# Patient Record
Sex: Male | Born: 1960 | Hispanic: Yes | Marital: Married | Smoking: Never smoker
Health system: Southern US, Community
[De-identification: ages and names within clinical notes are randomized; demographics above are authoritative.]

## PROBLEM LIST (undated history)

## (undated) ENCOUNTER — Emergency Department

## (undated) DIAGNOSIS — H269 Unspecified cataract: Secondary | ICD-10-CM

## (undated) DIAGNOSIS — K219 Gastro-esophageal reflux disease without esophagitis: Secondary | ICD-10-CM

## (undated) DIAGNOSIS — M1990 Unspecified osteoarthritis, unspecified site: Secondary | ICD-10-CM

## (undated) DIAGNOSIS — F329 Major depressive disorder, single episode, unspecified: Secondary | ICD-10-CM

## (undated) DIAGNOSIS — E119 Type 2 diabetes mellitus without complications: Secondary | ICD-10-CM

## (undated) DIAGNOSIS — E785 Hyperlipidemia, unspecified: Secondary | ICD-10-CM

## (undated) DIAGNOSIS — F32A Depression, unspecified: Secondary | ICD-10-CM

## (undated) HISTORY — DX: Major depressive disorder, single episode, unspecified: F32.9

## (undated) HISTORY — DX: Unspecified osteoarthritis, unspecified site: M19.90

## (undated) HISTORY — DX: Unspecified cataract: H26.9

## (undated) HISTORY — DX: Type 2 diabetes mellitus without complications: E11.9

## (undated) HISTORY — DX: Gastro-esophageal reflux disease without esophagitis: K21.9

## (undated) HISTORY — DX: Depression, unspecified: F32.A

## (undated) HISTORY — PX: EYE SURGERY: SHX253

## (undated) HISTORY — DX: Hyperlipidemia, unspecified: E78.5

---

## 1998-08-17 ENCOUNTER — Emergency Department (HOSPITAL_COMMUNITY): Admission: EM | Admit: 1998-08-17 | Discharge: 1998-08-18 | Payer: Self-pay | Admitting: Emergency Medicine

## 2000-01-23 ENCOUNTER — Ambulatory Visit (HOSPITAL_COMMUNITY): Admission: RE | Admit: 2000-01-23 | Discharge: 2000-01-23 | Payer: Self-pay | Admitting: *Deleted

## 2000-01-23 ENCOUNTER — Encounter: Payer: Self-pay | Admitting: *Deleted

## 2007-05-18 ENCOUNTER — Emergency Department: Payer: Self-pay | Admitting: Emergency Medicine

## 2015-02-02 ENCOUNTER — Encounter: Payer: Self-pay | Admitting: Nurse Practitioner

## 2015-02-02 ENCOUNTER — Ambulatory Visit (INDEPENDENT_AMBULATORY_CARE_PROVIDER_SITE_OTHER): Payer: BLUE CROSS/BLUE SHIELD | Admitting: Nurse Practitioner

## 2015-02-02 VITALS — BP 138/84 | HR 78 | Temp 98.3°F | Resp 12 | Ht 66.0 in | Wt 201.8 lb

## 2015-02-02 DIAGNOSIS — Z130 Encounter for screening for diseases of the blood and blood-forming organs and certain disorders involving the immune mechanism: Secondary | ICD-10-CM

## 2015-02-02 DIAGNOSIS — Z7689 Persons encountering health services in other specified circumstances: Secondary | ICD-10-CM

## 2015-02-02 DIAGNOSIS — Z131 Encounter for screening for diabetes mellitus: Secondary | ICD-10-CM

## 2015-02-02 DIAGNOSIS — Z7189 Other specified counseling: Secondary | ICD-10-CM

## 2015-02-02 DIAGNOSIS — R1032 Left lower quadrant pain: Secondary | ICD-10-CM

## 2015-02-02 MED ORDER — OMEPRAZOLE 40 MG PO CPDR: 40.0000 mg | DELAYED_RELEASE_CAPSULE | Freq: Every day | ORAL | Status: DC

## 2015-02-02 NOTE — Progress Notes (Signed)
Pre visit review using our clinic review tool, if applicable. No additional management support is needed unless otherwise documented below in the visit note. 

## 2015-02-02 NOTE — Progress Notes (Signed)
   Subjective:    Patient ID: Troy Ellis, male    DOB: 16-Apr-1961, 53 y.o.   MRN: [id number]  HPI  Troy Ellis is a 54 yo male here to establish care and CC of problems with gastrointestinal symptoms. His primary language is [place]. His daughter is accompanying him today in case he needs help with translation.   1) New Pt info:  Diet- Eats at home more often  Exercise- No formal  Immunizations- Need records  Colonoscopy- No colonoscopy, would like consult  PSA- checked last year   Eye Exam- Been awhile he reports  Dental Exam- Up to date   2) Chronic Problems-  Abdominal pain- between 3-6 months    Nausea, pain below breast bone, metallic taste- with eating beans or steak, staying the same, taking acid reducers every once in awhile- sometimes helpful.     Review of Systems  Constitutional: Negative for fever, chills, diaphoresis, activity change and fatigue.  HENT: Negative for sore throat, tinnitus and trouble swallowing.   Eyes: Negative for visual disturbance.  Respiratory: Negative for chest tightness, shortness of breath and wheezing.   Cardiovascular: Negative for chest pain, palpitations and leg swelling.  Gastrointestinal: Positive for abdominal pain. Negative for nausea, vomiting, diarrhea, constipation, blood in stool, abdominal distention, anal bleeding and rectal pain.  Genitourinary: Negative for dysuria and difficulty urinating.  Musculoskeletal: Negative for myalgias, back pain, joint swelling, arthralgias, gait problem, neck pain and neck stiffness.  Skin: Negative for rash.  Neurological: Negative for dizziness, weakness and numbness.       Objective:   Physical Exam  Constitutional: He is oriented to person, place, and time. He appears well-developed and well-nourished. No distress.  BP 138/84 mmHg  Pulse 78  Temp(Src) 98.3 F (36.8 C) (Oral)  Resp 12  Ht 5\' 6"  (1.676 m)  Wt 201 lb 12.8 oz (91.536 kg)  BMI 32.59 kg/m2  SpO2 96%   HENT:  Head:  Normocephalic and atraumatic.  Right Ear: External ear normal.  Left Ear: External ear normal.  Cardiovascular: Normal rate, regular rhythm, normal heart sounds and intact distal pulses.  Exam reveals no gallop and no friction rub.   No murmur heard. Pulmonary/Chest: Effort normal and breath sounds normal. No respiratory distress. He has no wheezes. He has no rales. He exhibits no tenderness.  Abdominal: Soft. Bowel sounds are normal. He exhibits no distension and no mass. There is tenderness. There is no rebound and no guarding.  Abdomen was tender with deep palpation only in the left lower quadrant  Neurological: He is alert and oriented to person, place, and time.  Skin: Skin is warm and dry. No rash noted. He is not diaphoretic.  Psychiatric: He has a normal mood and affect. His behavior is normal. Judgment and thought content normal.         Assessment & Plan:

## 2015-02-02 NOTE — Patient Instructions (Addendum)
Please visit the lab before leaving today.   We will contact you about your referral to gastroenterology.   We will follow up in 3 months.   Welcome to [facility]!

## 2015-02-03 LAB — CBC WITH DIFFERENTIAL/PLATELET
Basophils Absolute: 0 10*3/uL (ref 0.0–0.1)
Basophils Relative: 0.3 % (ref 0.0–3.0)
Eosinophils Absolute: 0.2 10*3/uL (ref 0.0–0.7)
Eosinophils Relative: 3.8 % (ref 0.0–5.0)
HCT: 42.9 % (ref 39.0–52.0)
Hemoglobin: 15.1 g/dL (ref 13.0–17.0)
Lymphocytes Relative: 28.1 % (ref 12.0–46.0)
Lymphs Abs: 1.3 10*3/uL (ref 0.7–4.0)
MCHC: 35.3 g/dL (ref 30.0–36.0)
MCV: 87.4 fl (ref 78.0–100.0)
Monocytes Absolute: 0.3 10*3/uL (ref 0.1–1.0)
Monocytes Relative: 6.4 % (ref 3.0–12.0)
Neutro Abs: 2.9 10*3/uL (ref 1.4–7.7)
Neutrophils Relative %: 61.4 % (ref 43.0–77.0)
Platelets: 157 10*3/uL (ref 150.0–400.0)
RBC: 4.91 Mil/uL (ref 4.22–5.81)
RDW: 13.1 % (ref 11.5–15.5)
WBC: 4.7 10*3/uL (ref 4.0–10.5)

## 2015-02-03 LAB — COMPREHENSIVE METABOLIC PANEL
ALT: 68 U/L — ABNORMAL HIGH (ref 0–53)
AST: 38 U/L — ABNORMAL HIGH (ref 0–37)
Albumin: 4.5 g/dL (ref 3.5–5.2)
Alkaline Phosphatase: 131 U/L — ABNORMAL HIGH (ref 39–117)
BUN: 24 mg/dL — ABNORMAL HIGH (ref 6–23)
CO2: 28 mEq/L (ref 19–32)
Calcium: 9.6 mg/dL (ref 8.4–10.5)
Chloride: 102 mEq/L (ref 96–112)
Creatinine, Ser: 0.8 mg/dL (ref 0.40–1.50)
GFR: 107.71 mL/min (ref >60.00)
Glucose, Bld: 160 mg/dL — ABNORMAL HIGH (ref 70–99)
Potassium: 4 mEq/L (ref 3.5–5.1)
Sodium: 136 mEq/L (ref 135–145)
Total Bilirubin: 0.4 mg/dL (ref 0.2–1.2)
Total Protein: 7.3 g/dL (ref 6.0–8.3)

## 2015-02-03 LAB — HEMOGLOBIN A1C: Hgb A1c MFr Bld: 6.8 % — ABNORMAL HIGH (ref 4.6–6.5)

## 2015-02-06 DIAGNOSIS — R1032 Left lower quadrant pain: Secondary | ICD-10-CM | POA: Insufficient documentation

## 2015-02-06 DIAGNOSIS — Z0000 Encounter for general adult medical examination without abnormal findings: Secondary | ICD-10-CM | POA: Insufficient documentation

## 2015-02-06 NOTE — Assessment & Plan Note (Addendum)
Discussed acute and chronic issues. Reviewed health maintenance measures, PFSHx, and immunizations. Obtain routine labs CBC w/ diff, A1c, and CMET. FU in 3 months

## 2015-02-06 NOTE — Assessment & Plan Note (Addendum)
Intermittent stable tenderness of LLQ- possible diverticulosis, will obtain CBC along with other needed lab work to check for infection. Pt requesting consultation for colonoscopy and will mention this in referral to GI. Asked pt to continue with Prilosec 40 mg daily and sent in rx for this.

## 2015-03-04 ENCOUNTER — Encounter: Payer: Self-pay | Admitting: Nurse Practitioner

## 2015-03-04 ENCOUNTER — Ambulatory Visit (INDEPENDENT_AMBULATORY_CARE_PROVIDER_SITE_OTHER): Payer: BLUE CROSS/BLUE SHIELD | Admitting: Nurse Practitioner

## 2015-03-04 VITALS — BP 108/62 | HR 60 | Temp 98.1°F | Resp 14 | Ht 66.0 in | Wt 200.0 lb

## 2015-03-04 DIAGNOSIS — IMO0002 Reserved for concepts with insufficient information to code with codable children: Secondary | ICD-10-CM

## 2015-03-04 DIAGNOSIS — R61 Generalized hyperhidrosis: Secondary | ICD-10-CM

## 2015-03-04 DIAGNOSIS — E1165 Type 2 diabetes mellitus with hyperglycemia: Secondary | ICD-10-CM | POA: Diagnosis not present

## 2015-03-04 DIAGNOSIS — Z87898 Personal history of other specified conditions: Secondary | ICD-10-CM

## 2015-03-04 LAB — TSH: TSH: 0.81 u[IU]/mL (ref 0.35–4.50)

## 2015-03-04 MED ORDER — METFORMIN HCL 500 MG PO TABS: 500.0000 mg | ORAL_TABLET | Freq: Two times a day (BID) | ORAL | Status: DC

## 2015-03-04 NOTE — Progress Notes (Signed)
Pre visit review using our clinic review tool, if applicable. No additional management support is needed unless otherwise documented below in the visit note. 

## 2015-03-04 NOTE — Patient Instructions (Signed)
Start on the Metformin. Take with meals twice a day  We will follow up in May for a re-evaluation of your A1c (3 months of blood sugars)   Goal for exercise- start 10-15 minutes a few times a week and work up to 30 min. 5 times a week.   Going for a walk, walking on the treadmill, and using light hand weights are helpful.

## 2015-03-04 NOTE — Progress Notes (Signed)
   Subjective:    Patient ID: Troy Ellis, male    DOB: [birth year], 54 y.o.   MRN: [id number]  HPI  Mr. Finbar is a 54 yo male here for a diabetic follow up. He is accompanied by his wife who is the primary historian.   1) Works during the day and in the evening he reports he sits down and watches tv.    No formal exercise, Diet- Tortillas   Eats flower and corn, amd [place], no sodas, gatorade   Frequent urination, every hour he reports   Frequent sweats Nocturia- 5-6 few nights, most nights up 2 x to urinate  Review of Systems  Constitutional: Positive for diaphoresis. Negative for fever, chills and fatigue.  Gastrointestinal: Negative for nausea, vomiting and diarrhea.  Endocrine: Positive for polydipsia and polyuria. Negative for polyphagia.  Genitourinary:       Nocturia      Objective:   Physical Exam  Constitutional: He is oriented to person, place, and time. He appears well-developed and well-nourished. No distress.  BP 108/62 mmHg  Pulse 60  Temp(Src) 98.1 F (36.7 C) (Oral)  Resp 14  Ht 5\' 6"  (1.676 m)  Wt 200 lb (90.719 kg)  BMI 32.30 kg/m2  SpO2 97%   HENT:  Head: Normocephalic and atraumatic.  Right Ear: External ear normal.  Left Ear: External ear normal.  Mouth/Throat: No oropharyngeal exudate.  Cardiovascular: Normal rate, regular rhythm and normal heart sounds.  Exam reveals no gallop and no friction rub.   No murmur heard. Pulmonary/Chest: Effort normal and breath sounds normal. No respiratory distress. He has no wheezes. He has no rales. He exhibits no tenderness.  Abdominal:  Obese  Neurological: He is alert and oriented to person, place, and time.  Skin: Skin is warm and dry. No rash noted. He is not diaphoretic.  Psychiatric: He has a normal mood and affect. His behavior is normal. Judgment and thought content normal.       Assessment & Plan:

## 2015-03-10 DIAGNOSIS — E119 Type 2 diabetes mellitus without complications: Secondary | ICD-10-CM | POA: Insufficient documentation

## 2015-03-10 DIAGNOSIS — Z87898 Personal history of other specified conditions: Secondary | ICD-10-CM | POA: Insufficient documentation

## 2015-03-10 NOTE — Assessment & Plan Note (Signed)
Will obtain TSH to check thyroid status for excessive sweating.

## 2015-03-10 NOTE — Assessment & Plan Note (Signed)
Lab Results  Component Value Date   HGBA1C 6.8* 02/02/2015   Lab Results  Component Value Date   CREATININE 0.80 02/02/2015   Discussed diet and exercise goals. Gave handout with information regarding a low carb diet. Will start on Metformin 500 mg tablet twice daily for 2 months and re-check A1c and get a microalbumin in May. Encouraged getting an Eye exam.

## 2015-04-04 ENCOUNTER — Encounter: Payer: Self-pay | Admitting: Nurse Practitioner

## 2015-05-06 ENCOUNTER — Ambulatory Visit: Payer: BLUE CROSS/BLUE SHIELD | Admitting: Nurse Practitioner

## 2015-05-13 ENCOUNTER — Ambulatory Visit: Payer: BLUE CROSS/BLUE SHIELD | Admitting: Nurse Practitioner

## 2015-05-27 ENCOUNTER — Ambulatory Visit: Payer: BLUE CROSS/BLUE SHIELD | Admitting: Nurse Practitioner

## 2015-07-01 ENCOUNTER — Ambulatory Visit (INDEPENDENT_AMBULATORY_CARE_PROVIDER_SITE_OTHER): Payer: Self-pay | Admitting: Nurse Practitioner

## 2015-07-01 VITALS — BP 122/78 | HR 71 | Temp 98.0°F | Resp 18 | Ht 66.0 in | Wt 191.0 lb

## 2015-07-01 DIAGNOSIS — IMO0002 Reserved for concepts with insufficient information to code with codable children: Secondary | ICD-10-CM

## 2015-07-01 DIAGNOSIS — E1165 Type 2 diabetes mellitus with hyperglycemia: Secondary | ICD-10-CM

## 2015-07-01 DIAGNOSIS — R748 Abnormal levels of other serum enzymes: Secondary | ICD-10-CM

## 2015-07-01 LAB — HEPATIC FUNCTION PANEL
ALT: 42 U/L (ref 0–53)
AST: 27 U/L (ref 0–37)
Albumin: 4.2 g/dL (ref 3.5–5.2)
Alkaline Phosphatase: 81 U/L (ref 39–117)
BILIRUBIN TOTAL: 0.6 mg/dL (ref 0.2–1.2)
Bilirubin, Direct: 0.1 mg/dL (ref 0.0–0.3)
TOTAL PROTEIN: 6.8 g/dL (ref 6.0–8.3)

## 2015-07-01 LAB — MICROALBUMIN / CREATININE URINE RATIO
Creatinine,U: 155.2 mg/dL
Microalb Creat Ratio: 0.5 mg/g (ref 0.0–30.0)
Microalb, Ur: <0.7 mg/dL (ref 0.0–1.9)

## 2015-07-01 LAB — HEMOGLOBIN A1C: Hgb A1c MFr Bld: 6.1 % (ref 4.6–6.5)

## 2015-07-01 MED ORDER — METFORMIN HCL 500 MG PO TABS: 500.0000 mg | ORAL_TABLET | Freq: Two times a day (BID) | ORAL | Status: DC

## 2015-07-01 NOTE — Patient Instructions (Signed)
Onychomycosis/Fungal Toenails  WHAT IS IT? An infection that lies within the keratin of your nail plate that is caused by a fungus.  WHY ME? Fungal infections affect all ages, sexes, races, and creeds.  There may be many factors that predispose you to a fungal infection such as age, coexisting medical conditions such as diabetes, or an autoimmune disease; stress, medications, fatigue, genetics, etc.  Bottom line: fungus thrives in a warm, moist environment and your shoes offer such a location.  IS IT CONTAGIOUS? Theoretically, yes.  You do not want to share shoes, nail clippers or files with someone who has fungal toenails.  Walking around barefoot in the same room or sleeping in the same bed is unlikely to transfer the organism.  It is important to realize, however, that fungus can spread easily from one nail to the next on the same foot.  HOW DO WE TREAT THIS?  There are several ways to treat this condition.  Treatment may depend on many factors such as age, medications, pregnancy, liver and kidney conditions, etc.  It is best to ask your doctor which options are available to you.  1. No treatment.   Unlike many other medical concerns, you can live with this condition.  However for many people this can be a painful condition and may lead to ingrown toenails or a bacterial infection.  It is recommended that you keep the nails cut short to help reduce the amount of fungal nail. 2. Topical treatment.  These range from herbal remedies to prescription strength nail lacquers.  About 40-50% effective, topicals require twice daily application for approximately 9 to 12 months or until an entirely new nail has grown out.  The most effective topicals are medical grade medications available through physicians offices. 1. Lotrimin Ultra- Over the counter there are many choices over the counter 3. Oral antifungal medications.  With an 80-90% cure rate, the most common oral medication requires 3 to 4 months of  therapy and stays in your system for a year as the new nail grows out.  Oral antifungal medications do require blood work to make sure it is a safe drug for you.  A liver function panel will be performed prior to starting the medication and after the first month of treatment.  It is important to have the blood work performed to avoid any harmful side effects.  In general, this medication safe but blood work is required. 4. Laser Therapy.  This treatment is performed by applying a specialized laser to the affected nail plate.  This therapy is noninvasive, fast, and non-painful.  It is not covered by insurance and is therefore, out of pocket.  The results have been very good with a 80-95% cure rate.  [facility] is the only practice in the area to offer this therapy. 5. Permanent Nail Avulsion.  Removing the entire nail so that a new nail will not grow back.

## 2015-07-01 NOTE — Progress Notes (Signed)
   Subjective:    Patient ID: Troy Ellis, male    DOB: Feb 01, 1961, 54 y.o.   MRN: [id number]  HPI  Troy Ellis is a 54 yo male with a follow up on his diabetes.   1) Is taking the metformin and doing well. Lost 9 lbs. Working hard and trying to lose weight.   Wt Readings from Last 3 Encounters:  07/01/15 191 lb (86.637 kg)  03/04/15 200 lb (90.719 kg)  02/02/15 201 lb 12.8 oz (91.536 kg)   2) Pt is concerned about his toenails being fungal.   Review of Systems  Constitutional: Negative for fever, chills, diaphoresis and fatigue.  Eyes: Negative for visual disturbance.  Respiratory: Negative for chest tightness, shortness of breath and wheezing.   Cardiovascular: Negative for chest pain, palpitations and leg swelling.  Gastrointestinal: Negative for nausea, vomiting and diarrhea.  Endocrine: Negative for polydipsia, polyphagia and polyuria.  Skin: Negative for rash.       Fungal changes to toenails  Neurological: Negative for dizziness, weakness and numbness.  Psychiatric/Behavioral: The patient is not nervous/anxious.       Objective:   Physical Exam  Constitutional: He is oriented to person, place, and time. He appears well-developed and well-nourished. No distress.  BP 122/78 mmHg  Pulse 71  Temp(Src) 98 F (36.7 C)  Resp 18  Ht 5\' 6"  (1.676 m)  Wt 191 lb (86.637 kg)  BMI 30.84 kg/m2  SpO2 95%   HENT:  Head: Normocephalic and atraumatic.  Right Ear: External ear normal.  Left Ear: External ear normal.  Cardiovascular: Normal rate, regular rhythm, normal heart sounds and intact distal pulses.  Exam reveals no gallop and no friction rub.   No murmur heard. Pulmonary/Chest: Effort normal and breath sounds normal. No respiratory distress. He has no wheezes. He has no rales. He exhibits no tenderness.  Neurological: He is alert and oriented to person, place, and time.  Skin: Skin is warm and dry. No rash noted. He is not diaphoretic.  Fungal changes to great  toenails  Psychiatric: He has a normal mood and affect. His behavior is normal. Judgment and thought content normal.      Assessment & Plan:

## 2015-07-12 ENCOUNTER — Encounter: Payer: Self-pay | Admitting: Nurse Practitioner

## 2015-07-12 DIAGNOSIS — K760 Fatty (change of) liver, not elsewhere classified: Secondary | ICD-10-CM | POA: Insufficient documentation

## 2015-07-12 NOTE — Assessment & Plan Note (Signed)
Pt asked about oral medication for toenail fungus and I discussed his history of elevated liver enzymes is not okay for placing on medications for this due to risk of liver injury. Asked pt to try topical OTC measures first. Will follow.

## 2015-07-12 NOTE — Assessment & Plan Note (Signed)
Getting A1c, urine microalbumin, and HFP today. Pt stable on Metformin and changing diet. Encouraged eye exam. Discussed next visit getting immunizations up to date. No wounds- fungal changes to toenails.

## 2015-08-09 ENCOUNTER — Other Ambulatory Visit: Payer: Self-pay | Admitting: Surgical

## 2015-08-09 MED ORDER — METFORMIN HCL 500 MG PO TABS: 500.0000 mg | ORAL_TABLET | Freq: Two times a day (BID) | ORAL | Status: DC

## 2015-08-09 NOTE — Telephone Encounter (Signed)
Patient is requesting 90 day supply.

## 2015-08-24 LAB — LIPID PANEL
CHOLESTEROL: 193 mg/dL (ref 0–200)
HDL: 44 mg/dL (ref 35–70)
LDL Cholesterol: 112 mg/dL
Triglycerides: 187 mg/dL — AB (ref 40–160)

## 2015-08-24 LAB — BASIC METABOLIC PANEL
BUN: 20 mg/dL (ref 4–21)
CREATININE: 0.9 mg/dL (ref 0.6–1.3)
GLUCOSE: 120 mg/dL
POTASSIUM: 4 mmol/L (ref 3.4–5.3)
Sodium: 141 mmol/L (ref 137–147)

## 2015-08-24 LAB — CBC AND DIFFERENTIAL
Platelets: 123 10*3/uL — AB (ref 150–399)
WBC: 4.5 10^3/mL

## 2015-08-25 ENCOUNTER — Ambulatory Visit (INDEPENDENT_AMBULATORY_CARE_PROVIDER_SITE_OTHER): Payer: Managed Care, Other (non HMO) | Admitting: Nurse Practitioner

## 2015-08-25 ENCOUNTER — Encounter: Payer: Self-pay | Admitting: Nurse Practitioner

## 2015-08-25 VITALS — BP 120/78 | HR 60 | Temp 98.2°F | Resp 18 | Ht 66.0 in | Wt 189.6 lb

## 2015-08-25 DIAGNOSIS — Z09 Encounter for follow-up examination after completed treatment for conditions other than malignant neoplasm: Secondary | ICD-10-CM | POA: Diagnosis not present

## 2015-08-25 NOTE — Progress Notes (Signed)
Patient ID: Troy Ellis, male    DOB: [birth year]  Age: 54 y.o. MRN: [id number]  CC: Hospitalization Follow-up   HPI Troy Ellis presents for Hospital Follow up for unspecified chest pain.   1) Pt reports symptoms of: Light headed, vision changes, pre-syncope. He was on the job in [place]. He was taken to an UC and they gave him 2 nitro for chest pain then they transported him to  [facility] in [place]- 2 nitro and ASA  Works outside and feels pre-syncopal when bending over and standing up fast.   Wife reports:  Labs Stress test  Echo? (May mean EKG)  Feels improved, but doesn't feel like himself    Felt this way in the past, passes after a few minutes   History [name] has a past medical history of Arthritis; Cataract; and Depression.   He has past surgical history that includes Eye surgery.   His family history includes Diabetes in his brother and brother.He reports that he has never smoked. He has never used smokeless tobacco. He reports that he drinks alcohol. He reports that he does not use illicit drugs.  Outpatient Prescriptions Prior to Visit  Medication Sig Dispense Refill  . omeprazole (PRILOSEC) 40 MG capsule Take 1 capsule (40 mg total) by mouth daily. 30 capsule 11  . metFORMIN (GLUCOPHAGE) 500 MG tablet Take 1 tablet (500 mg total) by mouth 2 (two) times daily with a meal. 180 tablet 0   No facility-administered medications prior to visit.    ROS Review of Systems  Constitutional: Negative for fever, chills, diaphoresis and fatigue.  Eyes: Negative for visual disturbance.  Respiratory: Negative for chest tightness, shortness of breath and wheezing.   Cardiovascular: Negative for chest pain, palpitations and leg swelling.  Gastrointestinal: Negative for nausea, vomiting and diarrhea.  Endocrine: Negative for polydipsia, polyphagia and polyuria.  Skin: Negative for rash.  Neurological: Negative for dizziness, weakness and numbness.   Psychiatric/Behavioral: The patient is not nervous/anxious.     Objective:  BP 120/78 mmHg  Pulse 60  Temp(Src) 98.2 F (36.8 C)  Resp 18  Ht 5\' 6"  (1.676 m)  Wt 189 lb 9.6 oz (86.002 kg)  BMI 30.62 kg/m2  SpO2 95%  Physical Exam  Constitutional: He is oriented to person, place, and time. He appears well-developed and well-nourished. No distress.  HENT:  Head: Normocephalic and atraumatic.  Right Ear: External ear normal.  Left Ear: External ear normal.  Cardiovascular: Normal rate, regular rhythm and normal heart sounds.  Exam reveals no gallop and no friction rub.   No murmur heard. Pulmonary/Chest: Effort normal and breath sounds normal. No respiratory distress. He has no wheezes. He has no rales. He exhibits no tenderness.  Neurological: He is alert and oriented to person, place, and time.  Skin: Skin is warm and dry. No rash noted. He is not diaphoretic.  Psychiatric: He has a normal mood and affect. His behavior is normal. Judgment and thought content normal.   Assessment & Plan:   Troy Ellis was seen today for hospitalization follow-up.  Diagnoses and all orders for this visit:  Hospital discharge follow-up  I have discontinued [name] metFORMIN. I am also having him maintain his omeprazole.  No orders of the defined types were placed in this encounter.     Follow-up: Return in about 3 months (around 11/24/2015).

## 2015-08-25 NOTE — Assessment & Plan Note (Signed)
Pt did not bring any paperwork. Will obtain records from [place]. Will add ASA 81 mg to daily regimen. Stop Metformin. Pt reports abdominal discomfort. Will obtain a1c in 3 months after being off of the medication. Will FU in 3 months.

## 2015-08-25 NOTE — Progress Notes (Signed)
Pre visit review using our clinic review tool, if applicable. No additional management support is needed unless otherwise documented below in the visit note. 

## 2015-08-25 NOTE — Patient Instructions (Signed)
Stay off of the metformin for 3 months and we can re-check your A1c.   Baby Aspirin (81 mg) enteric coated (EC) daily.   Continue to drink good water and eat enough to sustain your activities.   Follow up in 3 months.

## 2015-10-07 ENCOUNTER — Ambulatory Visit (INDEPENDENT_AMBULATORY_CARE_PROVIDER_SITE_OTHER): Payer: Managed Care, Other (non HMO) | Admitting: Nurse Practitioner

## 2015-10-07 ENCOUNTER — Ambulatory Visit: Payer: 59 | Admitting: Nurse Practitioner

## 2015-10-07 VITALS — BP 138/80 | HR 51 | Temp 98.0°F | Resp 18 | Ht 66.0 in | Wt 190.0 lb

## 2015-10-07 DIAGNOSIS — M1990 Unspecified osteoarthritis, unspecified site: Secondary | ICD-10-CM

## 2015-10-07 DIAGNOSIS — Z23 Encounter for immunization: Secondary | ICD-10-CM | POA: Diagnosis not present

## 2015-10-07 DIAGNOSIS — E119 Type 2 diabetes mellitus without complications: Secondary | ICD-10-CM | POA: Diagnosis not present

## 2015-10-07 LAB — CBC WITH DIFFERENTIAL/PLATELET
Basophils Absolute: 0 10*3/uL (ref 0.0–0.1)
Basophils Relative: 0.4 % (ref 0.0–3.0)
EOS ABS: 0.1 10*3/uL (ref 0.0–0.7)
EOS PCT: 3.7 % (ref 0.0–5.0)
HEMATOCRIT: 44.2 % (ref 39.0–52.0)
Hemoglobin: 15 g/dL (ref 13.0–17.0)
LYMPHS ABS: 1.2 10*3/uL (ref 0.7–4.0)
LYMPHS PCT: 29 % (ref 12.0–46.0)
MCHC: 33.8 g/dL (ref 30.0–36.0)
MCV: 90.1 fl (ref 78.0–100.0)
MONO ABS: 0.3 10*3/uL (ref 0.1–1.0)
MONOS PCT: 7.7 % (ref 3.0–12.0)
Neutro Abs: 2.4 10*3/uL (ref 1.4–7.7)
Neutrophils Relative %: 59.2 % (ref 43.0–77.0)
PLATELETS: 147 10*3/uL — AB (ref 150.0–400.0)
RBC: 4.91 Mil/uL (ref 4.22–5.81)
RDW: 13.3 % (ref 11.5–15.5)
WBC: 4 10*3/uL (ref 4.0–10.5)

## 2015-10-07 LAB — HEMOGLOBIN A1C: HEMOGLOBIN A1C: 6.1 % (ref 4.6–6.5)

## 2015-10-07 MED ORDER — MELOXICAM 15 MG PO TABS: 15.0000 mg | ORAL_TABLET | Freq: Every day | ORAL | Status: DC

## 2015-10-07 NOTE — Patient Instructions (Signed)
Please visit the lab before leaving today.   See you in 6 months!

## 2015-10-07 NOTE — Progress Notes (Signed)
Patient ID: Troy Ellis, male    DOB: 11/22/1961  Age: 54 y.o. MRN: [id number]  CC: Follow-up   HPI Troy Ellis presents for follow up of diabetes, arthritis, and weight.  1) Feeling well. No complaints, patient reports Troy Ellis is compliant with diet and exercise. Eye exam- Scheduled Dec. 2nd   Weight is up 1 lb from last visit.  2) Joint pain in wrists, hands and knees  Right knee > left   Aleve, ibuprofen, tylenol, heat and ice   History Troy Ellis has a past medical history of Arthritis; Cataract; and Depression.   Troy Ellis has past surgical history that includes Eye surgery.   His family history includes Diabetes in his brother and brother.Troy Ellis reports that Troy Ellis has never smoked. Troy Ellis has never used smokeless tobacco. Troy Ellis reports that Troy Ellis drinks alcohol. Troy Ellis reports that Troy Ellis does not use illicit drugs.  Outpatient Prescriptions Prior to Visit  Medication Sig Dispense Refill  . omeprazole (PRILOSEC) 40 MG capsule Take 1 capsule (40 mg total) by mouth daily. 30 capsule 11   No facility-administered medications prior to visit.    ROS Review of Systems  Constitutional: Negative for fever, chills, diaphoresis and fatigue.  Eyes: Negative for visual disturbance.  Respiratory: Negative for chest tightness, shortness of breath and wheezing.   Cardiovascular: Negative for chest pain, palpitations and leg swelling.  Gastrointestinal: Negative for nausea, vomiting and diarrhea.  Endocrine: Negative for polydipsia, polyphagia and polyuria.  Musculoskeletal: Positive for arthralgias.       Joint pain in wrists, hands, and knees.  Skin: Negative for rash.  Neurological: Negative for dizziness, weakness and numbness.  Psychiatric/Behavioral: The patient is not nervous/anxious.     Objective:  BP 138/80 mmHg  Pulse 51  Temp(Src) 98 F (36.7 C)  Resp 18  Ht 5\' 6"  (1.676 m)  Wt 190 lb (86.183 kg)  BMI 30.68 kg/m2  SpO2 97%  Physical Exam  Constitutional: Troy Ellis is oriented to person, place,  and time. Troy Ellis appears well-developed and well-nourished. No distress.  HENT:  Head: Normocephalic and atraumatic.  Right Ear: External ear normal.  Left Ear: External ear normal.  Cardiovascular: Normal rate, regular rhythm and normal heart sounds.  Exam reveals no gallop and no friction rub.   No murmur heard. Pulmonary/Chest: Effort normal and breath sounds normal. No respiratory distress. Troy Ellis has no wheezes. Troy Ellis has no rales. Troy Ellis exhibits no tenderness.  Musculoskeletal: Normal range of motion. Troy Ellis exhibits no edema or tenderness.  Pt is able to perform range of motion exercises with wrists, hands, knees without difficulty. There is no tenderness or edema to palpation of these joints.  Neurological: Troy Ellis is alert and oriented to person, place, and time.  Strength is normal in iliopsoas, quadriceps, extension of wrists and flexion of wrists. Fingers also have adequate strength with abduction and abduction. Grip strength is equal bilaterally  Skin: Skin is warm and dry. No rash noted. Troy Ellis is not diaphoretic.  Psychiatric: Troy Ellis has a normal mood and affect. His behavior is normal. Judgment and thought content normal.      Assessment & Plan:   Srijan was seen today for follow-up.  Diagnoses and all orders for this visit:  Encounter for immunization  Controlled type 2 diabetes mellitus without complication, without long-term current use of insulin (HCC) -     HgB A1c -     CBC with Differential/Platelet  Other orders -     Flu Vaccine QUAD 36+ mos IM -  meloxicam (MOBIC) 15 MG tablet; Take 1 tablet (15 mg total) by mouth daily.   I am having Troy Ellis start on meloxicam. I am also having him maintain his omeprazole.  Meds ordered this encounter  Medications  . meloxicam (MOBIC) 15 MG tablet    Sig: Take 1 tablet (15 mg total) by mouth daily.    Dispense:  30 tablet    Refill:  2    Order Specific Question:  Supervising Provider    Answer:  Crecencio Mc [[id number]]      Follow-up: Return in about 6 months (around 04/06/2016).

## 2015-10-07 NOTE — Progress Notes (Signed)
Pre visit review using our clinic review tool, if applicable. No additional management support is needed unless otherwise documented below in the visit note. 

## 2015-10-14 ENCOUNTER — Encounter: Payer: Self-pay | Admitting: Nurse Practitioner

## 2015-10-14 DIAGNOSIS — M1990 Unspecified osteoarthritis, unspecified site: Secondary | ICD-10-CM | POA: Insufficient documentation

## 2015-10-14 NOTE — Assessment & Plan Note (Signed)
A she is very active at current job. He uses his hands wrists and knees frequently. He has not tried previous treatments and would like to try medication he is currently taking over-the-counter NSAIDs. Asked him to stop these and we will try Mobitz 15 mg once daily. If he needs other medications asked him to use Tylenol. Patient was agreeable to this and we will follow-up in 6 months or sooner as needed

## 2015-10-14 NOTE — Assessment & Plan Note (Signed)
We're checking A1c and CBC with differential today. Patient is doing well asked him to continue working on diet and exercise.

## 2015-11-25 ENCOUNTER — Ambulatory Visit: Payer: 59 | Admitting: Nurse Practitioner

## 2015-12-03 ENCOUNTER — Other Ambulatory Visit: Payer: Self-pay | Admitting: Nurse Practitioner

## 2016-02-03 ENCOUNTER — Encounter: Payer: Self-pay | Admitting: Family Medicine

## 2016-02-03 ENCOUNTER — Ambulatory Visit (INDEPENDENT_AMBULATORY_CARE_PROVIDER_SITE_OTHER): Payer: Managed Care, Other (non HMO) | Admitting: Family Medicine

## 2016-02-03 VITALS — BP 116/78 | HR 61 | Temp 97.6°F | Ht 66.0 in | Wt 197.2 lb

## 2016-02-03 DIAGNOSIS — M533 Sacrococcygeal disorders, not elsewhere classified: Secondary | ICD-10-CM

## 2016-02-03 MED ORDER — TRAMADOL HCL 50 MG PO TABS: 50.0000 mg | ORAL_TABLET | Freq: Three times a day (TID) | ORAL | Status: DC | PRN

## 2016-02-03 NOTE — Progress Notes (Signed)
Pre visit review using our clinic review tool, if applicable. No additional management support is needed unless otherwise documented below in the visit note. 

## 2016-02-03 NOTE — Patient Instructions (Signed)
Use the tramadol as needed. ° °Take care ° °Dr. Odena Mcquaid   °

## 2016-02-03 NOTE — Progress Notes (Signed)
Subjective:  Patient ID: LAQUINCY BRY, male    DOB: 03-03-1961  Age: 55 y.o. MRN: [id number]  CC: [place], injured tailbone, still in pain  HPI:  55 year old male presents with the above complaint.  Fall, injured tailbone  Patient reports that he suffered a fall on Sunday. He failed directly onto his bottom injuring his tailbone.  He went to the hospital for evaluation. Hospital course reviewed and summarized as follows:  X-rays were obtained and were negative. No fracture noted. He was given pain medication and discharged home in stable condition.  Patient reports he continues to have pain that is worse with range of motion.  Pain described as "burning".  He took the Percocet that was prescribed but could not tolerate due to itching/nausea.  He is requesting something for pain today.   No known relieving factors.   No other complaints today.   Social Hx   Social History   Social History  . Marital Status: Married    Spouse Name: N/A  . Number of Children: N/A  . Years of Education: N/A   Social History Main Topics  . Smoking status: Never Smoker   . Smokeless tobacco: Never Used  . Alcohol Use: 0.0 oz/week    0 Standard drinks or equivalent per week  . Drug Use: No  . Sexual Activity: Not Asked   Other Topics Concern  . None   Social History Narrative   Review of Systems  Constitutional: Negative.   Musculoskeletal:       Pain, tailbone.   Objective:  BP 116/78 mmHg  Pulse 61  Temp(Src) 97.6 F (36.4 C) (Oral)  Ht 5\' 6"  (1.676 m)  Wt 197 lb 4 oz (89.472 kg)  BMI 31.85 kg/m2  SpO2 95%  BP/Weight 02/03/2016 [id number]  Systolic BP [id number]  Diastolic BP 78 80 78  Wt. (Lbs) 197.25 190 189.6  BMI 31.85 30.68 30.62   Physical Exam  Constitutional: He is oriented to person, place, and time. He appears well-developed. No distress.  Cardiovascular: Normal rate and regular rhythm.   Pulmonary/Chest: Effort normal and breath sounds normal.   Musculoskeletal:  Sacrum/coccyx - tender to palpation.  Neurological: He is alert and oriented to person, place, and time.  Vitals reviewed.   Lab Results  Component Value Date   WBC 4.0 10/07/2015   HGB 15.0 10/07/2015   HCT 44.2 10/07/2015   PLT 147.0* 10/07/2015   GLUCOSE 160* 02/02/2015   CHOL 193 08/24/2015   TRIG 187* 08/24/2015   HDL 44 08/24/2015   LDLCALC 112 08/24/2015   ALT 42 07/01/2015   AST 27 07/01/2015   NA 141 08/24/2015   K 4.0 08/24/2015   CL 102 02/02/2015   CREATININE 0.9 08/24/2015   BUN 20 08/24/2015   CO2 28 02/02/2015   TSH 0.81 03/04/2015   HGBA1C 6.1 10/07/2015   MICROALBUR <0.7 07/01/2015    Assessment & Plan:   Problem List Items Addressed This Visit    Coccydynia - Primary    New problem. ED course reviewed and summarized. Treating with Tramadol PRN.      Relevant Medications   traMADol (ULTRAM) 50 MG tablet      Meds ordered this encounter  Medications  . traMADol (ULTRAM) 50 MG tablet    Sig: Take 1 tablet (50 mg total) by mouth every 8 (eight) hours as needed.    Dispense:  60 tablet    Refill:  0   Follow-up:  PRN  [place]

## 2016-02-03 NOTE — Assessment & Plan Note (Signed)
New problem. ED course reviewed and summarized. Treating with Tramadol PRN.

## 2016-02-05 ENCOUNTER — Other Ambulatory Visit: Payer: Self-pay | Admitting: Nurse Practitioner

## 2016-02-06 NOTE — Telephone Encounter (Signed)
Was seen by Dr.Cook and didn't refill. Please advise?

## 2016-02-22 ENCOUNTER — Ambulatory Visit (INDEPENDENT_AMBULATORY_CARE_PROVIDER_SITE_OTHER): Payer: Managed Care, Other (non HMO) | Admitting: Gastroenterology

## 2016-02-22 ENCOUNTER — Encounter: Payer: Self-pay | Admitting: Gastroenterology

## 2016-02-22 VITALS — BP 116/60 | HR 78 | Ht 66.0 in | Wt 193.0 lb

## 2016-02-22 DIAGNOSIS — R159 Full incontinence of feces: Secondary | ICD-10-CM | POA: Diagnosis not present

## 2016-02-22 MED ORDER — NA SULFATE-K SULFATE-MG SULF 17.5-3.13-1.6 GM/177ML PO SOLN: 1.0000 | Freq: Once | ORAL | Status: DC

## 2016-02-22 NOTE — Patient Instructions (Signed)
Please start taking citrucel (orange flavored) powder fiber supplement.  This may cause some bloating at first but that usually goes away. Begin with a small spoonful and work your way up to a large, heaping spoonful daily over a week. You will be set up for a colonoscopy for fecal soilage.

## 2016-02-22 NOTE — Progress Notes (Signed)
HPI: This is a   very pleasant 55 year old man    who was referred to me by Rubbie Battiest, NP  to evaluate  fecal soilage, leakage .    Chief complaint is fecal soilage, leakage  Leakage of stool daily.  9-10 months.  Never sees blood.    No constipated and not having diarrhea.  Has regular solid BM daily or twice a day.  Overall his weight is up 4-5 pounds.  Fell 3-4 weeks ago, slipped and injured his backside.  No relation to eating.  Does not drink much etoh.  Has to wipe every hour or two.  Review of systems: Pertinent positive and negative review of systems were noted in the above HPI section. Complete review of systems was performed and was otherwise normal.   Past Medical History  Diagnosis Date  . Arthritis     both hands  . Cataract     x2 surgeries  . Depression     Past Surgical History  Procedure Laterality Date  . Eye surgery      Lasic    Current Outpatient Prescriptions  Medication Sig Dispense Refill  . omeprazole (PRILOSEC) 40 MG capsule Take 1 capsule (40 mg total) by mouth daily. 30 capsule 11   No current facility-administered medications for this visit.    Allergies as of 02/22/2016  . (No Known Allergies)    Family History  Problem Relation Age of Onset  . Diabetes Brother   . Diabetes Brother     Social History   Social History  . Marital Status: Married    Spouse Name: N/A  . Number of Children: N/A  . Years of Education: N/A   Occupational History  . Not on file.   Social History Main Topics  . Smoking status: Never Smoker   . Smokeless tobacco: Never Used  . Alcohol Use: 0.0 oz/week    0 Standard drinks or equivalent per week  . Drug Use: No  . Sexual Activity: Not on file   Other Topics Concern  . Not on file   Social History Narrative     Physical Exam: BP 116/60 mmHg  Pulse 78  Ht 5\' 6"  (1.676 m)  Wt 193 lb (87.544 kg)  BMI 31.17 kg/m2 Constitutional: generally well-appearing Psychiatric: alert and  oriented x3 Eyes: extraocular movements intact Mouth: oral pharynx moist, no lesions Neck: supple no lymphadenopathy Cardiovascular: heart regular rate and rhythm Lungs: clear to auscultation bilaterally Abdomen: soft, nontender, nondistended, no obvious ascites, no peritoneal signs, normal bowel sounds Extremities: no lower extremity edema bilaterally Skin: no lesions on visible extremities Rectal examination: Normal external examination, internally there were no distal rectal masses, stool was brown and Hemoccult negative, no strictures  Assessment and plan: 55 y.o. male with  daily fecal soilage, leakage  Unclear etiology, perhaps there is something anatomic more proximal in his rectum or sigmoid such as a stricture. I recommended we proceed with full colonoscopy at his soonest convenience he has never had colon cancer screening. In the meantime I recommended he begin once daily powder fiber supplements.   Owens Loffler, MD [facility] 02/22/2016, 2:59 PM  Cc: Rubbie Battiest, NP

## 2016-04-05 ENCOUNTER — Telehealth: Payer: Self-pay | Admitting: Gastroenterology

## 2016-04-05 NOTE — Telephone Encounter (Signed)
yes

## 2016-04-06 ENCOUNTER — Encounter: Payer: Managed Care, Other (non HMO) | Admitting: Gastroenterology

## 2016-04-06 ENCOUNTER — Ambulatory Visit: Payer: Managed Care, Other (non HMO) | Admitting: Nurse Practitioner

## 2016-05-04 ENCOUNTER — Telehealth: Payer: Self-pay | Admitting: Gastroenterology

## 2016-05-04 ENCOUNTER — Encounter: Payer: Managed Care, Other (non HMO) | Admitting: Gastroenterology

## 2016-05-07 ENCOUNTER — Telehealth: Payer: Self-pay | Admitting: *Deleted

## 2016-05-09 ENCOUNTER — Ambulatory Visit: Payer: Managed Care, Other (non HMO) | Admitting: Nurse Practitioner

## 2016-05-18 NOTE — Telephone Encounter (Signed)
Pt not Billed Colon No show Fee. Has no Insurance

## 2016-07-16 NOTE — Telephone Encounter (Signed)
  Follow up Call-  No flowsheet data found.   Patient questions:  There was a message left at the time of call during the callback after the procedure.

## 2016-08-03 ENCOUNTER — Ambulatory Visit: Payer: Managed Care, Other (non HMO) | Admitting: Family Medicine

## 2016-10-02 ENCOUNTER — Encounter: Payer: Self-pay | Admitting: Family

## 2016-10-02 ENCOUNTER — Ambulatory Visit (INDEPENDENT_AMBULATORY_CARE_PROVIDER_SITE_OTHER): Payer: BLUE CROSS/BLUE SHIELD | Admitting: Family

## 2016-10-02 VITALS — BP 130/78 | HR 72 | Temp 97.9°F | Ht 66.0 in | Wt 193.6 lb

## 2016-10-02 DIAGNOSIS — Z23 Encounter for immunization: Secondary | ICD-10-CM | POA: Diagnosis not present

## 2016-10-02 DIAGNOSIS — M542 Cervicalgia: Secondary | ICD-10-CM

## 2016-10-02 DIAGNOSIS — Z7689 Persons encountering health services in other specified circumstances: Secondary | ICD-10-CM

## 2016-10-02 MED ORDER — PREDNISONE 10 MG PO TABS: ORAL_TABLET | ORAL | 0 refills | Status: DC

## 2016-10-02 MED ORDER — DULOXETINE HCL 30 MG PO CPEP: 30.0000 mg | ORAL_CAPSULE | Freq: Two times a day (BID) | ORAL | 3 refills | Status: DC

## 2016-10-02 NOTE — Assessment & Plan Note (Signed)
Colonoscopy ordered which is due. Screening labs ordered. Patient will return when fasting to complete screening labs on Friday of this week and also will schedule CPE.

## 2016-10-02 NOTE — Patient Instructions (Addendum)
Overdue on preventative care. Please make follow up physical appointment. Please also make lab apppointment for Friday.   Trial of prednisone and cymbalta.  Let's treat conservatively as we discussed.   Over-the-counter medications you may try for arthritic pain include:   ThermaCare patches   Capsaicin cream   Icy hot   If conservative treatment doesn't yield results, we will consider physical therapy, consult to Sports Medicine/Orthopedics for further evaluation, and imaging.   If there is no improvement in your symptoms, or if there is any worsening of symptoms, or if you have any additional concerns, please return for re-evaluation; or, if we are closed, consider going to the Emergency Room for evaluation if symptoms urgent.

## 2016-10-02 NOTE — Assessment & Plan Note (Signed)
Improving. Patient also has a history of low back pain which be concurrent with this symptoms. Trial of prednisone, Cymbalta to see if this relieves pain. Normal neurologic exam. We'll continue to follow.

## 2016-10-02 NOTE — Progress Notes (Signed)
Subjective:    Patient ID: Troy Ellis, male    DOB: 06-19-1961, 55 y.o.   MRN: [id number]  CC: CHON STANDARD is a 55 y.o. male who presents today for follow up.   HPI: Patient presents for acute on chronic visit for left neck pain and also to establish care. Left pain is constant since injury years ago.  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 Wearing helmet.  Pain over left neck and radiates to left back.Has  tried medications tyleonol arthritis with some relief. Shoulder pain improves with rest. Pain not exaberated by moving left arm. Describes as 'stiffness' and occasionally numbness in arms. Shoulder and leg pain worse at end of day after work carrying supplies and walking for work. Not sleeping well due to pain.  No vision changes, HA.  Occasionally both legs also hurt, worse in left leg. no LE numbness or tingling, or  weakness. States shooting pain down his posterior left leg. Fell 6 months ago and had pain on coccyx which is  improving.   Denies exertional chest pain or pressure, numbness or tingling radiating to left arm or jaw, palpitations, dizziness, frequent headaches, changes in vision, or shortness of breath.   H/o depression and tried medication ( cannot recall name) and wife thought he was 'much better'. More irritable per wife. Not tearful.   Due for CPE and screening labs.   Patient has no history of seizure. Does not drink alcohol.   DG cervical spine 2008 no acute abnormality.  MR lumbar for low back pain and left hip pain 2001-mild disc degeneration, mild diffuse disc bulge, minimal impression left s1 nerve root.  HISTORY:  Past Medical History:  Diagnosis Date  . Arthritis    both hands  . Cataract    x2 surgeries  . Depression    Past Surgical History:  Procedure Laterality Date  . EYE SURGERY     Lasic   Family History  Problem Relation Age of Onset  . Diabetes Brother   . Diabetes Brother      Allergies: Review of patient's allergies indicates no known allergies. No current outpatient prescriptions on file prior to visit.   No current facility-administered medications on file prior to visit.     Social History  Substance Use Topics  . Smoking status: Never Smoker  . Smokeless tobacco: Never Used  . Alcohol use 0.0 oz/week    Review of Systems  Constitutional: Negative for chills and fever.  Respiratory: Negative for cough.   Cardiovascular: Negative for chest pain and palpitations.  Gastrointestinal: Negative for nausea and vomiting.  Musculoskeletal: Positive for back pain, neck pain and neck stiffness.  Neurological: Positive for numbness. Negative for dizziness, weakness and headaches.      Objective:    BP 130/78   Pulse 72   Temp 97.9 F (36.6 C) (Oral)   Ht 5\' 6"  (1.676 m)   Wt 193 lb 9.6 oz (87.8 kg)   SpO2 98%   BMI 31.25 kg/m  BP Readings from Last 3 Encounters:  10/02/16 130/78  02/22/16 116/60  02/03/16 116/78   Wt Readings from Last 3 Encounters:  10/02/16 193 lb 9.6 oz (87.8 kg)  02/22/16 193 lb (87.5 kg)  02/03/16 197 lb 4 oz (89.5 kg)    Physical Exam  Constitutional: He appears well-developed and well-nourished.  Cardiovascular: Regular rhythm and normal heart sounds.   Pulmonary/Chest: Effort normal and breath  sounds normal. No respiratory distress. He has no wheezes. He has no rales.  Musculoskeletal:       Cervical back: He exhibits tenderness. He exhibits normal range of motion, no bony tenderness, no pain and no spasm.       Lumbar back: He exhibits normal range of motion, no tenderness, no swelling, no pain and no spasm.       Back:  Neck- Pain as noted on left side of diagram. Pain with spurlings test, no numbness, tingling.    Low back- Full range of motion with flexion, extension, lateral side bends. No pain, numbness, tingling elicited with single leg raise bilaterally. No rash.  Neurological: He is alert. He has  normal strength. He displays a negative Romberg sign.  Reflex Scores:      Bicep reflexes are 2+ on the right side and 2+ on the left side.      Patellar reflexes are 2+ on the right side and 2+ on the left side. BUE 5/5. BLE 5/5. Sensation intact.  Finger to nose normal.    Skin: Skin is warm and dry.  Psychiatric: He has a normal mood and affect. His speech is normal and behavior is normal.  Vitals reviewed.      Assessment & Plan:   Problem List Items Addressed This Visit      Other   Encounter to establish care    Colonoscopy ordered which is due. Screening labs ordered. Patient will return when fasting to complete screening labs on Friday of this week and also will schedule CPE.      Relevant Orders   CBC with Differential/Platelet   Comprehensive metabolic panel   Hemoglobin A1c   Lipid panel   TSH   VITAMIN D 25 Hydroxy (Vit-D Deficiency, Fractures)   HIV antibody   PSA   Ambulatory referral to Gastroenterology   Neck pain - Primary    Trial of prednisone and Cymbalta. Cymbalta chosen also due to h/o depression. Suspect degenerative disease, likely exacerbated by the patient's line of work and injury a few years ago. Reassured by normal neurologic exam, negative Spurling's test. Follow-up in one month.      Relevant Medications   DULoxetine (CYMBALTA) 30 MG capsule   predniSONE (DELTASONE) 10 MG tablet    Other Visit Diagnoses   None.      I have discontinued [name] omeprazole and Na Sulfate-K Sulfate-Mg Sulf. I am also having him start on DULoxetine and predniSONE.   Meds ordered this encounter  Medications  . DULoxetine (CYMBALTA) 30 MG capsule    Sig: Take 1 capsule (30 mg total) by mouth 2 (two) times daily. Take one 30 mg tablet by mouth once a day for the first week. Then increase to one 30 mg tablet by mouth twice daily and stay at that dose thereafter.    Dispense:  60 capsule    Refill:  3    Order Specific Question:   Supervising Provider     Answer:   Deborra Medina L [[id number]]  . predniSONE (DELTASONE) 10 MG tablet    Sig: Take 4 tablets ( total 40 mg) by mouth for 2 days; take 3 tablets ( total 30 mg) by mouth for 2 days; take 2 tablets ( total 20 mg) by mouth for 1 day; take 1 tablet ( total 10 mg) by mouth for 1 day.    Dispense:  17 tablet    Refill:  0    Order Specific Question:  Supervising Provider    Answer:   Crecencio Mc [[id number]]    Return precautions given.   Risks, benefits, and alternatives of the medications and treatment plan prescribed today were discussed, and patient expressed understanding.   Education regarding symptom management and diagnosis given to patient on AVS.  Continue to follow with Mable Paris, FNP for routine health maintenance.   Shawnee Knapp and I agreed with plan.   Mable Paris, FNP

## 2016-10-02 NOTE — Assessment & Plan Note (Addendum)
Trial of prednisone and Cymbalta. Normotensive.Cymbalta chosen also due to h/o depression. Suspect degenerative disease, likely exacerbated by the patient's line of work and injury a few years ago. Reassured by normal neurologic exam, negative Spurling's test. Follow-up in one month.

## 2016-10-05 ENCOUNTER — Other Ambulatory Visit (INDEPENDENT_AMBULATORY_CARE_PROVIDER_SITE_OTHER): Payer: BLUE CROSS/BLUE SHIELD

## 2016-10-05 DIAGNOSIS — Z7689 Persons encountering health services in other specified circumstances: Secondary | ICD-10-CM

## 2016-10-05 LAB — CBC WITH DIFFERENTIAL/PLATELET
BASOS ABS: 0 10*3/uL (ref 0.0–0.1)
BASOS PCT: 0.4 % (ref 0.0–3.0)
EOS ABS: 0.1 10*3/uL (ref 0.0–0.7)
Eosinophils Relative: 2 % (ref 0.0–5.0)
HEMATOCRIT: 42.2 % (ref 39.0–52.0)
HEMOGLOBIN: 14.8 g/dL (ref 13.0–17.0)
LYMPHS PCT: 35.9 % (ref 12.0–46.0)
Lymphs Abs: 1.7 10*3/uL (ref 0.7–4.0)
MCHC: 35.1 g/dL (ref 30.0–36.0)
MCV: 88.3 fl (ref 78.0–100.0)
MONO ABS: 0.4 10*3/uL (ref 0.1–1.0)
Monocytes Relative: 7.7 % (ref 3.0–12.0)
NEUTROS ABS: 2.5 10*3/uL (ref 1.4–7.7)
Neutrophils Relative %: 54 % (ref 43.0–77.0)
PLATELETS: 143 10*3/uL — AB (ref 150.0–400.0)
RBC: 4.78 Mil/uL (ref 4.22–5.81)
RDW: 13.1 % (ref 11.5–15.5)
WBC: 4.6 10*3/uL (ref 4.0–10.5)

## 2016-10-05 LAB — COMPREHENSIVE METABOLIC PANEL
ALT: 85 U/L — AB (ref 0–53)
AST: 37 U/L (ref 0–37)
Albumin: 4.3 g/dL (ref 3.5–5.2)
Alkaline Phosphatase: 83 U/L (ref 39–117)
BILIRUBIN TOTAL: 0.7 mg/dL (ref 0.2–1.2)
BUN: 23 mg/dL (ref 6–23)
CHLORIDE: 105 meq/L (ref 96–112)
CO2: 31 meq/L (ref 19–32)
CREATININE: 0.81 mg/dL (ref 0.40–1.50)
Calcium: 9 mg/dL (ref 8.4–10.5)
GFR: 105.5 mL/min (ref >60.00)
GLUCOSE: 129 mg/dL — AB (ref 70–99)
Potassium: 3.7 mEq/L (ref 3.5–5.1)
SODIUM: 142 meq/L (ref 135–145)
Total Protein: 7 g/dL (ref 6.0–8.3)

## 2016-10-05 LAB — LIPID PANEL
CHOL/HDL RATIO: 4
Cholesterol: 194 mg/dL (ref 0–200)
HDL: 53.2 mg/dL (ref >39.00)
LDL CALC: 116 mg/dL — AB (ref 0–99)
NONHDL: 140.85
Triglycerides: 124 mg/dL (ref 0.0–149.0)
VLDL: 24.8 mg/dL (ref 0.0–40.0)

## 2016-10-05 LAB — PSA: PSA: 0.06 ng/mL — ABNORMAL LOW (ref 0.10–4.00)

## 2016-10-05 LAB — TSH: TSH: 0.77 u[IU]/mL (ref 0.35–4.50)

## 2016-10-05 LAB — VITAMIN D 25 HYDROXY (VIT D DEFICIENCY, FRACTURES): VITD: 28.08 ng/mL — AB (ref 30.00–100.00)

## 2016-10-05 LAB — HEMOGLOBIN A1C: Hgb A1c MFr Bld: 6.5 % (ref 4.6–6.5)

## 2016-10-06 ENCOUNTER — Other Ambulatory Visit: Payer: Self-pay | Admitting: Family

## 2016-10-06 DIAGNOSIS — R748 Abnormal levels of other serum enzymes: Secondary | ICD-10-CM

## 2016-10-06 DIAGNOSIS — E785 Hyperlipidemia, unspecified: Secondary | ICD-10-CM

## 2016-10-06 DIAGNOSIS — D696 Thrombocytopenia, unspecified: Secondary | ICD-10-CM

## 2016-10-06 DIAGNOSIS — E119 Type 2 diabetes mellitus without complications: Secondary | ICD-10-CM

## 2016-10-06 LAB — HIV ANTIBODY (ROUTINE TESTING W REFLEX): HIV: NONREACTIVE

## 2016-10-06 MED ORDER — METFORMIN HCL ER 500 MG PO TB24: ORAL_TABLET | ORAL | 3 refills | Status: DC

## 2016-10-06 MED ORDER — ATORVASTATIN CALCIUM 80 MG PO TABS: 80.0000 mg | ORAL_TABLET | Freq: Every day | ORAL | 3 refills | Status: DC

## 2016-10-10 ENCOUNTER — Encounter: Payer: Self-pay | Admitting: Gastroenterology

## 2016-10-10 ENCOUNTER — Encounter: Payer: Self-pay | Admitting: Family

## 2016-10-12 ENCOUNTER — Telehealth: Payer: Self-pay | Admitting: Family

## 2016-10-12 NOTE — Telephone Encounter (Signed)
Pt wife called and stated that pt was previously on Metformin and has previously had reactions such as fainting and profusely sweating. Pt would like to try dietary changes first before starting medication.  Call pt @ [phone]

## 2016-10-12 NOTE — Telephone Encounter (Signed)
Call patient   Always happy to hear patient would rather try lifestyle first. That is fine.  Please ensure f/u appt in 3 mos.

## 2016-10-12 NOTE — Telephone Encounter (Signed)
Please advise 

## 2016-10-16 NOTE — Telephone Encounter (Signed)
Left message for patient to return call back.  

## 2016-10-17 ENCOUNTER — Ambulatory Visit: Payer: BLUE CROSS/BLUE SHIELD

## 2016-10-18 NOTE — Telephone Encounter (Signed)
Left message for patient to return call back.  

## 2016-10-19 NOTE — Telephone Encounter (Signed)
Letter has been mailed.

## 2016-12-14 ENCOUNTER — Telehealth: Payer: Self-pay | Admitting: *Deleted

## 2016-12-14 NOTE — Telephone Encounter (Signed)
Pt no show for previsit 12/14/16. Message left to call and reschedule PV today by 5pm. If appt not rescheduled, colonoscopy scheduled for 12/28/16 will be cancelled and both PV and colon will need to be rescheduled for a later date

## 2016-12-14 NOTE — Telephone Encounter (Signed)
Reached pt on wife's cell phone. PV and colon rescheduled for 01/25/17 and 02/08/17.

## 2016-12-20 ENCOUNTER — Encounter: Payer: BLUE CROSS/BLUE SHIELD | Admitting: Family

## 2016-12-28 ENCOUNTER — Encounter: Payer: BLUE CROSS/BLUE SHIELD | Admitting: Gastroenterology

## 2017-01-25 ENCOUNTER — Ambulatory Visit (AMBULATORY_SURGERY_CENTER): Payer: Self-pay | Admitting: Gastroenterology

## 2017-01-25 DIAGNOSIS — Z1211 Encounter for screening for malignant neoplasm of colon: Secondary | ICD-10-CM

## 2017-01-25 MED ORDER — NA SULFATE-K SULFATE-MG SULF 17.5-3.13-1.6 GM/177ML PO SOLN: ORAL | 0 refills | Status: DC

## 2017-01-25 NOTE — Progress Notes (Signed)
Pt denies allergies to eggs or soy products. Denies difficulty with sedation or anesthesia. Denies any diet or weight loss medications. Denies use of supplemental oxygen.  Emmi instructions given for procedure.  

## 2017-01-28 ENCOUNTER — Encounter: Payer: Self-pay | Admitting: Gastroenterology

## 2017-02-08 ENCOUNTER — Encounter: Payer: Self-pay | Admitting: Gastroenterology

## 2017-02-08 ENCOUNTER — Ambulatory Visit (AMBULATORY_SURGERY_CENTER): Payer: Managed Care, Other (non HMO) | Admitting: Gastroenterology

## 2017-02-08 VITALS — BP 111/70 | HR 51 | Temp 97.5°F | Resp 13 | Ht 66.0 in | Wt 193.0 lb

## 2017-02-08 DIAGNOSIS — Z1212 Encounter for screening for malignant neoplasm of rectum: Secondary | ICD-10-CM

## 2017-02-08 DIAGNOSIS — D122 Benign neoplasm of ascending colon: Secondary | ICD-10-CM

## 2017-02-08 DIAGNOSIS — D123 Benign neoplasm of transverse colon: Secondary | ICD-10-CM

## 2017-02-08 DIAGNOSIS — Z1211 Encounter for screening for malignant neoplasm of colon: Secondary | ICD-10-CM | POA: Diagnosis present

## 2017-02-08 DIAGNOSIS — D129 Benign neoplasm of anus and anal canal: Secondary | ICD-10-CM

## 2017-02-08 DIAGNOSIS — D128 Benign neoplasm of rectum: Secondary | ICD-10-CM | POA: Diagnosis not present

## 2017-02-08 MED ORDER — SODIUM CHLORIDE 0.9 % IV SOLN: 500.0000 mL | INTRAVENOUS | Status: AC

## 2017-02-08 NOTE — Progress Notes (Signed)
Called to room to assist during endoscopic procedure.  Patient ID and intended procedure confirmed with present staff. Received instructions for my participation in the procedure from the performing physician.  

## 2017-02-08 NOTE — Progress Notes (Signed)
Pt's states no medical or surgical changes since previsit or office visit. 

## 2017-02-08 NOTE — Progress Notes (Signed)
To recovery vss report to [employer]

## 2017-02-08 NOTE — Op Note (Signed)
[facility] [place] Patient Name: Troy Ellis Procedure Date: 02/08/2017 2:53 PM MRN: [id number] Endoscopist: Milus Banister , MD Age: 56 Referring MD:  Date of Birth: 1961/09/24 Gender: Male Account #: [id number] Procedure:                Colonoscopy Indications:              Screening for colorectal malignant neoplasm Medicines:                Monitored Anesthesia Care Procedure:                Pre-Anesthesia Assessment:                           - Prior to the procedure, a History and Physical                            was performed, and patient medications and                            allergies were reviewed. The patient's tolerance of                            previous anesthesia was also reviewed. The risks                            and benefits of the procedure and the sedation                            options and risks were discussed with the patient.                            All questions were answered, and informed consent                            was obtained. Prior Anticoagulants: The patient has                            taken no previous anticoagulant or antiplatelet                            agents. ASA Grade Assessment: II - A patient with                            mild systemic disease. After reviewing the risks                            and benefits, the patient was deemed in                            satisfactory condition to undergo the procedure.                           After obtaining informed consent, the colonoscope  was passed under direct vision. Throughout the                            procedure, the patient's blood pressure, pulse, and                            oxygen saturations were monitored continuously. The                            Model PCF-H190L [id number]) scope was introduced                            through the anus and advanced to the the cecum,                            identified by  appendiceal orifice and ileocecal                            valve. The colonoscopy was performed without                            difficulty. The patient tolerated the procedure                            well. The quality of the bowel preparation was                            good. The ileocecal valve, appendiceal orifice, and                            rectum were photographed. Scope In: 3:01:55 PM Scope Out: 3:13:59 PM Scope Withdrawal Time: 0 hours 9 minutes 56 seconds  Total Procedure Duration: 0 hours 12 minutes 4 seconds  Findings:                 Five sessile polyps were found in the rectum,                            transverse colon and ascending colon. The polyps                            were 2 to 6 mm in size. These polyps were removed                            with a cold snare. Resection and retrieval were                            complete.                           The exam was otherwise without abnormality on                            direct and retroflexion views. Complications:  No immediate complications. Estimated blood loss:                            None. Estimated Blood Loss:     Estimated blood loss: none. Impression:               - Five 2 to 6 mm polyps in the rectum, in the                            transverse colon and in the ascending colon,                            removed with a cold snare. Resected and retrieved.                           - The examination was otherwise normal on direct                            and retroflexion views. Recommendation:           - Patient has a contact number available for                            emergencies. The signs and symptoms of potential                            delayed complications were discussed with the                            patient. Return to normal activities tomorrow.                            Written discharge instructions were provided to the                             patient.                           - Resume previous diet.                           - Continue present medications.                           You will receive a letter within 2-3 weeks with the                            pathology results and my final recommendations.                           If the polyp(s) is proven to be 'pre-cancerous' on                            pathology, you will need repeat colonoscopy in 3-5  years. If the polyp(s) is NOT 'precancerous' on                            pathology then you should repeat colon cancer                            screening in 10 years with colonoscopy without need                            for colon cancer screening by any method prior to                            then (including stool testing). Milus Banister, MD 02/08/2017 3:16:58 PM This report has been signed electronically.

## 2017-02-08 NOTE — Patient Instructions (Signed)
Handouts given:  Polyps   YOU HAD AN ENDOSCOPIC PROCEDURE TODAY AT THE [facility]:   Refer to the procedure report that was given to you for any specific questions about what was found during the examination.  If the procedure report does not answer your questions, please call your gastroenterologist to clarify.  If you requested that your care partner not be given the details of your procedure findings, then the procedure report has been included in a sealed envelope for you to review at your convenience later.  YOU SHOULD EXPECT: Some feelings of bloating in the abdomen. Passage of more gas than usual.  Walking can help get rid of the air that was put into your GI tract during the procedure and reduce the bloating. If you had a lower endoscopy (such as a colonoscopy or flexible sigmoidoscopy) you may notice spotting of blood in your stool or on the toilet paper. If you underwent a bowel prep for your procedure, you may not have a normal bowel movement for a few days.  Please Note:  You might notice some irritation and congestion in your nose or some drainage.  This is from the oxygen used during your procedure.  There is no need for concern and it should clear up in a day or so.  SYMPTOMS TO REPORT IMMEDIATELY:   Following lower endoscopy (colonoscopy or flexible sigmoidoscopy):  Excessive amounts of blood in the stool  Significant tenderness or worsening of abdominal pains  Swelling of the abdomen that is new, acute  Fever of 100F or higher   For urgent or emergent issues, a gastroenterologist can be reached at any hour by calling [phone].   DIET:  We do recommend a small meal at first, but then you may proceed to your regular diet.  Drink plenty of fluids but you should avoid alcoholic beverages for 24 hours.  ACTIVITY:  You should plan to take it easy for the rest of today and you should NOT DRIVE or use heavy machinery until tomorrow (because of the sedation  medicines used during the test).    FOLLOW UP: Our staff will call the number listed on your records the next business day following your procedure to check on you and address any questions or concerns that you may have regarding the information given to you following your procedure. If we do not reach you, we will leave a message.  However, if you are feeling well and you are not experiencing any problems, there is no need to return our call.  We will assume that you have returned to your regular daily activities without incident.  If any biopsies were taken you will be contacted by phone or by letter within the next 1-3 weeks.  Please call us at [phone] if you have not heard about the biopsies in 3 weeks.    SIGNATURES/CONFIDENTIALITY: You and/or your care partner have signed paperwork which will be entered into your electronic medical record.  These signatures attest to the fact that that the information above on your After Visit Summary has been reviewed and is understood.  Full responsibility of the confidentiality of this discharge information lies with you and/or your care-partner. 

## 2017-02-11 ENCOUNTER — Telehealth: Payer: Self-pay | Admitting: *Deleted

## 2017-02-11 NOTE — Telephone Encounter (Signed)
  Follow up Call-  Call back number 02/08/2017  Post procedure Call Back phone  # [phone]  Permission to leave phone message Yes  Some recent data might be hidden     Patient questions:  Do you have a fever, pain , or abdominal swelling? No. Pain Score  0 *  Have you tolerated food without any problems? Yes.    Have you been able to return to your normal activities? Yes.    Do you have any questions about your discharge instructions: Diet   No. Medications  No. Follow up visit  No.  Do you have questions or concerns about your Care? No.  Actions: * If pain score is 4 or above: No action needed, pain <4.

## 2017-02-18 ENCOUNTER — Encounter: Payer: Self-pay | Admitting: Gastroenterology

## 2017-02-27 ENCOUNTER — Other Ambulatory Visit: Payer: Self-pay | Admitting: Family

## 2017-02-27 DIAGNOSIS — M542 Cervicalgia: Secondary | ICD-10-CM

## 2017-03-01 ENCOUNTER — Other Ambulatory Visit: Payer: Self-pay

## 2017-03-01 ENCOUNTER — Telehealth: Payer: Self-pay | Admitting: *Deleted

## 2017-03-01 DIAGNOSIS — M542 Cervicalgia: Secondary | ICD-10-CM

## 2017-03-01 MED ORDER — DULOXETINE HCL 30 MG PO CPEP: 30.0000 mg | ORAL_CAPSULE | Freq: Two times a day (BID) | ORAL | 3 refills | Status: DC

## 2017-03-01 NOTE — Telephone Encounter (Signed)
Medication has been refilled.

## 2017-03-01 NOTE — Telephone Encounter (Signed)
Requested medication refill for :Cymbalta  Pharmacy:[facility] in [place]

## 2017-03-25 ENCOUNTER — Ambulatory Visit (INDEPENDENT_AMBULATORY_CARE_PROVIDER_SITE_OTHER): Payer: Managed Care, Other (non HMO) | Admitting: Family

## 2017-03-25 ENCOUNTER — Encounter: Payer: Self-pay | Admitting: Family

## 2017-03-25 VITALS — BP 122/64 | HR 74 | Temp 98.3°F | Ht 66.0 in | Wt 197.6 lb

## 2017-03-25 DIAGNOSIS — E785 Hyperlipidemia, unspecified: Secondary | ICD-10-CM | POA: Diagnosis not present

## 2017-03-25 DIAGNOSIS — M542 Cervicalgia: Secondary | ICD-10-CM | POA: Diagnosis not present

## 2017-03-25 DIAGNOSIS — J40 Bronchitis, not specified as acute or chronic: Secondary | ICD-10-CM | POA: Diagnosis not present

## 2017-03-25 MED ORDER — DULOXETINE HCL 30 MG PO CPEP: 30.0000 mg | ORAL_CAPSULE | Freq: Two times a day (BID) | ORAL | 3 refills | Status: DC

## 2017-03-25 MED ORDER — BENZONATATE 100 MG PO CAPS: 100.0000 mg | ORAL_CAPSULE | Freq: Two times a day (BID) | ORAL | 0 refills | Status: DC | PRN

## 2017-03-25 MED ORDER — AZITHROMYCIN 250 MG PO TABS: ORAL_TABLET | ORAL | 0 refills | Status: DC

## 2017-03-25 MED ORDER — ATORVASTATIN CALCIUM 80 MG PO TABS: 80.0000 mg | ORAL_TABLET | Freq: Every day | ORAL | 3 refills | Status: DC

## 2017-03-25 NOTE — Progress Notes (Signed)
Subjective:    Patient ID: Troy Ellis, male    DOB: [birth year], 56 y.o.   MRN: [id number]  CC: Troy Ellis is a 56 y.o. male who presents today for an acute visit.    HPI: CC: dry  cough x 5 days, waxing and waning.   Endorses HA, chills. Felt tactile warm 3 days ago, resolved. Continues to have chills at  Night.[place] OTC cough syrup with honey and mucinex without relief.  No wheezing, sob, cp.   Works around Ambulance person.   h/o seasonal allergies  No lung disease        HISTORY:  Past Medical History:  Diagnosis Date  . Arthritis    both hands  . Cataract    x2 surgeries  . Depression   . Diabetes mellitus without complication ([place])    controlled with diet and excercise  . GERD (gastroesophageal reflux disease)   . Hyperlipidemia    Past Surgical History:  Procedure Laterality Date  . EYE SURGERY     Lasic   Family History  Problem Relation Age of Onset  . Diabetes Brother   . Diabetes Brother   . Colon cancer Neg Hx     Allergies: Patient has no known allergies. No current outpatient prescriptions on file prior to visit.   Current Facility-Administered Medications on File Prior to Visit  Medication Dose Route Frequency Provider Last Rate Last Dose  . 0.9 %  sodium chloride infusion  500 mL Intravenous Continuous Milus Banister, MD        Social History  Substance Use Topics  . Smoking status: Never Smoker  . Smokeless tobacco: Never Used  . Alcohol use No     Comment: occ    Review of Systems  Constitutional: Positive for chills. Negative for fever.  HENT: Positive for congestion.   Respiratory: Positive for cough. Negative for shortness of breath and wheezing.   Cardiovascular: Negative for chest pain and palpitations.  Gastrointestinal: Negative for nausea and vomiting.  Neurological: Positive for headaches.      Objective:    BP 122/64   Pulse 74   Temp 98.3 F (36.8 C) (Oral)   Ht 5\' 6"  (1.676 m)   Wt 197 lb 9.6 oz  (89.6 kg)   SpO2 97%   BMI 31.89 kg/m    Physical Exam  Constitutional: Vital signs are normal. He appears well-developed and well-nourished.  HENT:  Head: Normocephalic and atraumatic.  Right Ear: Hearing, tympanic membrane, external ear and ear canal normal. No drainage, swelling or tenderness. Tympanic membrane is not injected, not erythematous and not bulging. No middle ear effusion. No decreased hearing is noted.  Left Ear: Hearing, tympanic membrane, external ear and ear canal normal. No drainage, swelling or tenderness. Tympanic membrane is not injected, not erythematous and not bulging.  No middle ear effusion. No decreased hearing is noted.  Nose: Nose normal. Right sinus exhibits no maxillary sinus tenderness and no frontal sinus tenderness. Left sinus exhibits no maxillary sinus tenderness and no frontal sinus tenderness.  Mouth/Throat: Uvula is midline, oropharynx is clear and moist and mucous membranes are normal. No oropharyngeal exudate, posterior oropharyngeal edema, posterior oropharyngeal erythema or tonsillar abscesses.  Eyes: Conjunctivae are normal.  Cardiovascular: Regular rhythm and normal heart sounds.   Pulmonary/Chest: Effort normal and breath sounds normal. No respiratory distress. He has no wheezes. He has no rhonchi. He has no rales.  Lymphadenopathy:       Head (right side):  No submental, no submandibular, no tonsillar, no preauricular, no posterior auricular and no occipital adenopathy present.       Head (left side): No submental, no submandibular, no tonsillar, no preauricular, no posterior auricular and no occipital adenopathy present.    He has no cervical adenopathy.  Neurological: He is alert.  Skin: Skin is warm and dry.  Psychiatric: He has a normal mood and affect. His speech is normal and behavior is normal.  Vitals reviewed.      Assessment & Plan:   1. Hyperlipidemia, unspecified hyperlipidemia type refilled - atorvastatin (LIPITOR) 80 MG  tablet; Take 1 tablet (80 mg total) by mouth daily.  Dispense: 90 tablet; Refill: 3  2. Neck pain refilled - DULoxetine (CYMBALTA) 30 MG capsule; Take 1 capsule (30 mg total) by mouth 2 (two) times daily. Take one 30 mg tablet by mouth twice daily.  Dispense: 60 capsule; Refill: 3  3. Bronchitis Patient is well appearing however due to continued chills daily, we jointly agreed antibiotic appropriate.afebrile. sa02 97. Return precautions given.    - azithromycin (ZITHROMAX) 250 MG tablet; Tale 500 mg PO on day 1, then 250 mg PO q24h x 4 days.  Dispense: 6 tablet; Refill: 0 - benzonatate (TESSALON) 100 MG capsule; Take 1 capsule (100 mg total) by mouth 2 (two) times daily as needed for cough.  Dispense: 20 capsule; Refill: 0    I have discontinued Mr. Madan predniSONE. I am also having him maintain his atorvastatin and DULoxetine. We will continue to administer sodium chloride.   Meds ordered this encounter  Medications  . atorvastatin (LIPITOR) 80 MG tablet    Sig: Take 1 tablet (80 mg total) by mouth daily.    Dispense:  90 tablet    Refill:  3  . DULoxetine (CYMBALTA) 30 MG capsule    Sig: Take 1 capsule (30 mg total) by mouth 2 (two) times daily. Take one 30 mg tablet by mouth twice daily.    Dispense:  60 capsule    Refill:  3    Return precautions given.   Risks, benefits, and alternatives of the medications and treatment plan prescribed today were discussed, and patient expressed understanding.   Education regarding symptom management and diagnosis given to patient on AVS.  Continue to follow with Mable Paris, FNP for routine health maintenance.   Shawnee Knapp and I agreed with plan.   Mable Paris, FNP

## 2017-03-25 NOTE — Patient Instructions (Signed)
Start antibiotic  Tessalon as needed  Let me know if not better   Acute Bronchitis, Adult Acute bronchitis is sudden (acute) swelling of the air tubes (bronchi) in the lungs. Acute bronchitis causes these tubes to fill with mucus, which can make it hard to breathe. It can also cause coughing or wheezing. In adults, acute bronchitis usually goes away within 2 weeks. A cough caused by bronchitis may last up to 3 weeks. Smoking, allergies, and asthma can make the condition worse. Repeated episodes of bronchitis may cause further lung problems, such as chronic obstructive pulmonary disease (COPD). What are the causes? This condition can be caused by germs and by substances that irritate the lungs, including:  Cold and flu viruses. This condition is most often caused by the same virus that causes a cold.  Bacteria.  Exposure to tobacco smoke, dust, fumes, and air pollution. What increases the risk? This condition is more likely to develop in people who:  Have close contact with someone with acute bronchitis.  Are exposed to lung irritants, such as tobacco smoke, dust, fumes, and vapors.  Have a weak immune system.  Have a respiratory condition such as asthma. What are the signs or symptoms? Symptoms of this condition include:  A cough.  Coughing up clear, yellow, or green mucus.  Wheezing.  Chest congestion.  Shortness of breath.  A fever.  Body aches.  Chills.  A sore throat. How is this diagnosed? This condition is usually diagnosed with a physical exam. During the exam, your health care provider may order tests, such as chest X-rays, to rule out other conditions. He or she may also:  Test a sample of your mucus for bacterial infection.  Check the level of oxygen in your blood. This is done to check for pneumonia.  Do a chest X-ray or lung function testing to rule out pneumonia and other conditions.  Perform blood tests. Your health care provider will also ask  about your symptoms and medical history. How is this treated? Most cases of acute bronchitis clear up over time without treatment. Your health care provider may recommend:  Drinking more fluids. Drinking more makes your mucus thinner, which may make it easier to breathe.  Taking a medicine for a fever or cough.  Taking an antibiotic medicine.  Using an inhaler to help improve shortness of breath and to control a cough.  Using a cool mist vaporizer or humidifier to make it easier to breathe. Follow these instructions at home: Medicines   Take over-the-counter and prescription medicines only as told by your health care provider.  If you were prescribed an antibiotic, take it as told by your health care provider. Do not stop taking the antibiotic even if you start to feel better. General instructions   Get plenty of rest.  Drink enough fluids to keep your urine clear or pale yellow.  Avoid smoking and secondhand smoke. Exposure to cigarette smoke or irritating chemicals will make bronchitis worse. If you smoke and you need help quitting, ask your health care provider. Quitting smoking will help your lungs heal faster.  Use an inhaler, cool mist vaporizer, or humidifier as told by your health care provider.  Keep all follow-up visits as told by your health care provider. This is important. How is this prevented? To lower your risk of getting this condition again:  Wash your hands often with soap and water. If soap and water are not available, use hand sanitizer.  Avoid contact with people who  have cold symptoms.  Try not to touch your hands to your mouth, nose, or eyes.  Make sure to get the flu shot every year. Contact a health care provider if:  Your symptoms do not improve in 2 weeks of treatment. Get help right away if:  You cough up blood.  You have chest pain.  You have severe shortness of breath.  You become dehydrated.  You faint or keep feeling like you are  going to faint.  You keep vomiting.  You have a severe headache.  Your fever or chills gets worse. This information is not intended to replace advice given to you by your health care provider. Make sure you discuss any questions you have with your health care provider. Document Released: 01/10/2005 Document Revised: 06/27/2016 Document Reviewed: 05/23/2016 Elsevier Interactive Patient Education  2017 [employer].

## 2017-04-01 ENCOUNTER — Telehealth: Payer: Self-pay | Admitting: Family

## 2017-04-01 DIAGNOSIS — J0110 Acute frontal sinusitis, unspecified: Secondary | ICD-10-CM

## 2017-04-01 MED ORDER — AMOXICILLIN-POT CLAVULANATE ER 1000-62.5 MG PO TB12: 2.0000 | ORAL_TABLET | Freq: Two times a day (BID) | ORAL | 0 refills | Status: DC

## 2017-04-01 NOTE — Telephone Encounter (Signed)
Patients wife advised of below and verbalized understanding  

## 2017-04-01 NOTE — Telephone Encounter (Signed)
Reason for call: facial pressure , bark , dry cough  Symptoms: soreness in face  facial pressure barky dry , deep cough, cough throughout the day, cough never subsided , no shortness of breath, sweating, no chills ,  Duration since last seen  Medications: None  Last Seen 03/25/17 Works out of town in [place]  Please advise , spoke with [place]

## 2017-04-01 NOTE — Telephone Encounter (Signed)
Call pt-  He was no better on azithromycin? That is an antibiotic.   Makes me think that it is viral.   Is he taking MUCINEX?   Please advise one to two days of mucinex, lots of water and tessalon. If no better,. May start augmentin ( sent to pharmacy)  Concern with two back to back antibiotics.   Advise probiotics during and 2 weeks after treatment.

## 2017-04-01 NOTE — Telephone Encounter (Signed)
Pt spouse called and stated that the pt is still got a deep cough and now complaining that his face hurts. So they think that he has a sinus infection. They were wondering if they could prescribe something else. Pt works out of town. Please advise, thank you!  Call pt @ [phone]  Pharmacy - [id number] - [place], [place]

## 2017-04-08 MED ORDER — NA SULFATE-K SULFATE-MG SULF 17.5-3.13-1.6 GM/177ML PO SOLN: ORAL | 0 refills | Status: DC

## 2017-06-28 ENCOUNTER — Ambulatory Visit (INDEPENDENT_AMBULATORY_CARE_PROVIDER_SITE_OTHER): Payer: Managed Care, Other (non HMO)

## 2017-06-28 ENCOUNTER — Ambulatory Visit (INDEPENDENT_AMBULATORY_CARE_PROVIDER_SITE_OTHER): Payer: Managed Care, Other (non HMO) | Admitting: Family Medicine

## 2017-06-28 ENCOUNTER — Encounter: Payer: Self-pay | Admitting: Family

## 2017-06-28 ENCOUNTER — Encounter: Payer: Self-pay | Admitting: Family Medicine

## 2017-06-28 VITALS — BP 112/76 | HR 50 | Temp 97.8°F | Wt 192.1 lb

## 2017-06-28 DIAGNOSIS — M545 Low back pain, unspecified: Secondary | ICD-10-CM | POA: Insufficient documentation

## 2017-06-28 MED ORDER — DICLOFENAC SODIUM 75 MG PO TBEC: 75.0000 mg | DELAYED_RELEASE_TABLET | Freq: Two times a day (BID) | ORAL | 0 refills | Status: DC

## 2017-06-28 NOTE — Progress Notes (Signed)
Subjective:  Patient ID: Troy Ellis, male    DOB: [birth year]  Age: 56 y.o. MRN: [id number]  CC: Low back pain   HPI:  56 year old male with diabetes presents with complaints of low back pain.  Patient appears to have had a viral illness earlier this week. He severe diarrhea. Improved with over-the-counter treatment. He states that over the past week he's had low back pain. Moderate in severity. No recent fall, trauma, injury. Worse with lying down. No reported exacerbating factors. No medications tried. No reports of radicular symptoms. No other associated symptoms. No other complaints at this time.  Of note, patient is a poor historian. There seems to be a language and cultural barrier.  Social Hx   Social History   Social History  . Marital status: Married    Spouse name: N/A  . Number of children: N/A  . Years of education: N/A   Social History Main Topics  . Smoking status: Never Smoker  . Smokeless tobacco: Never Used  . Alcohol use No     Comment: occ  . Drug use: No  . Sexual activity: Not Asked   Other Topics Concern  . None   Social History Narrative   Works as Nature conservation officer.       Married.       Grandfather .    Review of Systems  Constitutional: Negative.   Musculoskeletal: Positive for back pain.   Objective:  BP 112/76 (BP Location: Left Arm, Patient Position: Sitting, Cuff Size: Normal)   Pulse (!) 50   Temp 97.8 F (36.6 C) (Oral)   Wt 192 lb 2 oz (87.1 kg)   SpO2 98%   BMI 31.01 kg/m   BP/Weight 06/28/2017 03/25/2017 0/76/2263  Systolic BP 335 456 256  Diastolic BP 76 64 70  Wt. (Lbs) 192.13 197.6 193  BMI 31.01 31.89 31.15    Physical Exam  Constitutional: He appears well-developed. No distress.  Cardiovascular: Regular rhythm.   Bradycardic. No murmur.  Pulmonary/Chest: Effort normal. He has no wheezes. He has no rales.  Musculoskeletal:  Lumbar spine - nontender to palpation. Appears to have normal range of motion. No  paraspinal muscular tension or spasm.  Neurological: He is alert.  Psychiatric: He has a normal mood and affect.  Vitals reviewed.   Lab Results  Component Value Date   WBC 4.6 10/05/2016   HGB 14.8 10/05/2016   HCT 42.2 10/05/2016   PLT 143.0 (L) 10/05/2016   GLUCOSE 129 (H) 10/05/2016   CHOL 194 10/05/2016   TRIG 124.0 10/05/2016   HDL 53.20 10/05/2016   LDLCALC 116 (H) 10/05/2016   ALT 85 (H) 10/05/2016   AST 37 10/05/2016   NA 142 10/05/2016   K 3.7 10/05/2016   CL 105 10/05/2016   CREATININE 0.81 10/05/2016   BUN 23 10/05/2016   CO2 31 10/05/2016   TSH 0.77 10/05/2016   PSA 0.06 (L) 10/05/2016   HGBA1C 6.5 10/05/2016   MICROALBUR <0.7 07/01/2015    Assessment & Plan:   Problem List Items Addressed This Visit      Other   Acute midline low back pain without sciatica - Primary    New problem. Imaging today. Diclofenac as directed.      Relevant Medications   diclofenac (VOLTAREN) 75 MG EC tablet   Other Relevant Orders   DG Lumbar Spine Complete      Meds ordered this encounter  Medications  . diclofenac (VOLTAREN) 75 MG EC tablet  Sig: Take 1 tablet (75 mg total) by mouth 2 (two) times daily.    Dispense:  60 tablet    Refill:  0   Follow-up: PRN  [place]

## 2017-06-28 NOTE — Assessment & Plan Note (Signed)
New problem. Imaging today. Diclofenac as directed.

## 2017-06-28 NOTE — Patient Instructions (Signed)
Diclofenac as directed for pain.  We will call with the xray results.  Take care  Dr. Lacinda Axon

## 2017-07-21 ENCOUNTER — Other Ambulatory Visit: Payer: Self-pay | Admitting: Family

## 2017-07-21 DIAGNOSIS — M542 Cervicalgia: Secondary | ICD-10-CM

## 2017-08-15 ENCOUNTER — Other Ambulatory Visit: Payer: Self-pay | Admitting: Family Medicine

## 2017-12-31 ENCOUNTER — Other Ambulatory Visit: Payer: Self-pay | Admitting: Family

## 2017-12-31 DIAGNOSIS — E119 Type 2 diabetes mellitus without complications: Secondary | ICD-10-CM

## 2018-03-04 ENCOUNTER — Other Ambulatory Visit: Payer: Self-pay

## 2018-03-04 DIAGNOSIS — E119 Type 2 diabetes mellitus without complications: Secondary | ICD-10-CM

## 2018-03-04 NOTE — Telephone Encounter (Signed)
lov 03/25/17 No office visit scheduled

## 2018-03-05 MED ORDER — METFORMIN HCL ER 500 MG PO TB24: ORAL_TABLET | ORAL | 0 refills | Status: DC

## 2018-03-05 NOTE — Telephone Encounter (Signed)
Refilled.  Advise pt that NO further refills until he is seen Please make f.u visit

## 2018-03-13 NOTE — Telephone Encounter (Signed)
Left voice mail to call back ok for [facility] to speak to patient 

## 2018-04-01 NOTE — Telephone Encounter (Signed)
Letter mailed

## 2018-04-18 ENCOUNTER — Ambulatory Visit: Payer: Managed Care, Other (non HMO) | Admitting: Family

## 2018-04-26 ENCOUNTER — Other Ambulatory Visit: Payer: Self-pay | Admitting: Family

## 2018-04-26 DIAGNOSIS — E785 Hyperlipidemia, unspecified: Secondary | ICD-10-CM

## 2018-05-09 ENCOUNTER — Encounter: Payer: Self-pay | Admitting: Family

## 2018-05-09 ENCOUNTER — Ambulatory Visit: Payer: Managed Care, Other (non HMO) | Admitting: Family

## 2018-05-09 VITALS — BP 122/82 | HR 66 | Temp 98.1°F | Resp 16 | Wt 193.0 lb

## 2018-05-09 DIAGNOSIS — N183 Chronic kidney disease, stage 3 unspecified: Secondary | ICD-10-CM

## 2018-05-09 DIAGNOSIS — M79604 Pain in right leg: Secondary | ICD-10-CM | POA: Diagnosis not present

## 2018-05-09 DIAGNOSIS — M79605 Pain in left leg: Secondary | ICD-10-CM | POA: Diagnosis not present

## 2018-05-09 DIAGNOSIS — R5383 Other fatigue: Secondary | ICD-10-CM

## 2018-05-09 DIAGNOSIS — E1122 Type 2 diabetes mellitus with diabetic chronic kidney disease: Secondary | ICD-10-CM

## 2018-05-09 DIAGNOSIS — R748 Abnormal levels of other serum enzymes: Secondary | ICD-10-CM | POA: Diagnosis not present

## 2018-05-09 DIAGNOSIS — L659 Nonscarring hair loss, unspecified: Secondary | ICD-10-CM | POA: Insufficient documentation

## 2018-05-09 LAB — TSH: TSH: 0.73 u[IU]/mL (ref 0.35–4.50)

## 2018-05-09 LAB — COMPREHENSIVE METABOLIC PANEL
ALBUMIN: 4.5 g/dL (ref 3.5–5.2)
ALK PHOS: 164 U/L — AB (ref 39–117)
ALT: 97 U/L — ABNORMAL HIGH (ref 0–53)
AST: 46 U/L — ABNORMAL HIGH (ref 0–37)
BUN: 15 mg/dL (ref 6–23)
CALCIUM: 9.5 mg/dL (ref 8.4–10.5)
CO2: 31 mEq/L (ref 19–32)
Chloride: 100 mEq/L (ref 96–112)
Creatinine, Ser: 0.74 mg/dL (ref 0.40–1.50)
GFR: 116.42 mL/min (ref >60.00)
Glucose, Bld: 296 mg/dL — ABNORMAL HIGH (ref 70–99)
Potassium: 4.2 mEq/L (ref 3.5–5.1)
Sodium: 139 mEq/L (ref 135–145)
Total Bilirubin: 0.8 mg/dL (ref 0.2–1.2)
Total Protein: 6.8 g/dL (ref 6.0–8.3)

## 2018-05-09 LAB — CBC WITH DIFFERENTIAL/PLATELET
BASOS ABS: 0 10*3/uL (ref 0.0–0.1)
BASOS PCT: 0.5 % (ref 0.0–3.0)
Eosinophils Absolute: 0.1 10*3/uL (ref 0.0–0.7)
Eosinophils Relative: 2.8 % (ref 0.0–5.0)
HEMATOCRIT: 43.8 % (ref 39.0–52.0)
HEMOGLOBIN: 15.3 g/dL (ref 13.0–17.0)
LYMPHS PCT: 25.5 % (ref 12.0–46.0)
Lymphs Abs: 0.8 10*3/uL (ref 0.7–4.0)
MCHC: 34.9 g/dL (ref 30.0–36.0)
MCV: 89.3 fl (ref 78.0–100.0)
Monocytes Absolute: 0.2 10*3/uL (ref 0.1–1.0)
Monocytes Relative: 6 % (ref 3.0–12.0)
Neutro Abs: 2.1 10*3/uL (ref 1.4–7.7)
Neutrophils Relative %: 65.2 % (ref 43.0–77.0)
Platelets: 140 10*3/uL — ABNORMAL LOW (ref 150.0–400.0)
RBC: 4.9 Mil/uL (ref 4.22–5.81)
RDW: 13.1 % (ref 11.5–15.5)
WBC: 3.3 10*3/uL — AB (ref 4.0–10.5)

## 2018-05-09 LAB — HEMOGLOBIN A1C: HEMOGLOBIN A1C: 11.2 % — AB (ref 4.6–6.5)

## 2018-05-09 LAB — PSA: PSA: 0.11 ng/mL (ref 0.10–4.00)

## 2018-05-09 LAB — VITAMIN D 25 HYDROXY (VIT D DEFICIENCY, FRACTURES): VITD: 30.57 ng/mL (ref 30.00–100.00)

## 2018-05-09 NOTE — Progress Notes (Signed)
Subjective:    Patient ID: Troy Ellis, male    DOB: Feb 02, 1961, 57 y.o.   MRN: [id number]  CC: Troy Ellis is a 57 y.o. male who presents today for follow up.   HPI: DM- had low blood sugars ( didn't take blood sugar reading);  One day last September had felt like would pass out,  stopped metformin and symptom resolved. Was seen in [place] and hospitalized at that time. Unable to see work up.   Noticed hair loss, small nickel size several months ago, unchanged.. No itching, bleeding. Tried OTC hair regrow. Had been treated for hair loss in the past.   Also feels tired for several months, unchanged.  Not sleeping well. No snoring, exercise. Would like Testerone checked.   Chronic elevated LFTs- 9 beers for whole week.   Chronic BLE pain in calves with walking. Varicosities.  Improves with rest.    HISTORY:  Past Medical History:  Diagnosis Date  . Arthritis    both hands  . Cataract    x2 surgeries  . Depression   . Diabetes mellitus without complication ([place])    controlled with diet and excercise  . GERD (gastroesophageal reflux disease)   . Hyperlipidemia    Past Surgical History:  Procedure Laterality Date  . EYE SURGERY     Lasic   Family History  Problem Relation Age of Onset  . Diabetes Brother   . Diabetes Brother   . Colon cancer Neg Hx     Allergies: Patient has no known allergies. Current Outpatient Medications on File Prior to Visit  Medication Sig Dispense Refill  . atorvastatin (LIPITOR) 80 MG tablet Take 1 tablet (80 mg total) by mouth daily. MUST KEEP APPT ON 05/09/18 FOR A 90 DAYS SUPPLY 15 tablet 0  . diclofenac (VOLTAREN) 75 MG EC tablet TAKE 1 TABLET BY MOUTH TWICE A DAY 60 tablet 0  . DULoxetine (CYMBALTA) 30 MG capsule TAKE 1 CAPSULE BY MOUTH 2 TIMES DAILY 60 capsule 3  . Na Sulfate-K Sulfate-Mg Sulf 17.5-3.13-1.6 GM/180ML SOLN Suprep as directed.  No substitutions. 354 mL 0  . metFORMIN (GLUCOPHAGE-XR) 500 MG 24 hr tablet TAKE 1  TABLET BY MOUTH EVERY EVENING (Patient not taking: Reported on 05/09/2018) 90 tablet 0   Current Facility-Administered Medications on File Prior to Visit  Medication Dose Route Frequency Provider Last Rate Last Dose  . 0.9 %  sodium chloride infusion  500 mL Intravenous Continuous Milus Banister, MD        Social History   Tobacco Use  . Smoking status: Never Smoker  . Smokeless tobacco: Never Used  Substance Use Topics  . Alcohol use: No    Alcohol/week: 0.0 oz    Comment: occ  . Drug use: No    Review of Systems  Constitutional: Positive for fatigue. Negative for chills and fever.  Eyes: Negative for visual disturbance.  Respiratory: Negative for cough and shortness of breath.   Cardiovascular: Negative for chest pain, palpitations and leg swelling.  Gastrointestinal: Negative for nausea and vomiting.  Musculoskeletal: Positive for myalgias (leg pain).  Neurological: Negative for dizziness, syncope, weakness, light-headedness and headaches.      Objective:    BP 122/82 (BP Location: Left Arm, Patient Position: Sitting, Cuff Size: Normal)   Pulse 66   Temp 98.1 F (36.7 C) (Oral)   Resp 16   Wt 193 lb (87.5 kg)   SpO2 99%   BMI 31.15 kg/m  BP Readings from Last  3 Encounters:  05/09/18 122/82  06/28/17 112/76  03/25/17 122/64   Wt Readings from Last 3 Encounters:  05/09/18 193 lb (87.5 kg)  06/28/17 192 lb 2 oz (87.1 kg)  03/25/17 197 lb 9.6 oz (89.6 kg)    Physical Exam  Constitutional: He appears well-developed and well-nourished.  HENT:  Head:    Circular distribution of loss of hair.  No black specks appreciated.  Skin intact.  Cardiovascular: Regular rhythm and normal heart sounds.  No LE edema, palpable cords or masses. No erythema or increased warmth. No asymmetry in calf size when compared bilaterally LE hair growth symmetric and present.   varicosities noted.  LE warm and palpable pedal pulses.    Pulmonary/Chest: Effort normal and breath  sounds normal. No respiratory distress. He has no wheezes. He has no rhonchi. He has no rales.  Lymphadenopathy:       Head (left side): No submandibular and no preauricular adenopathy present.  Neurological: He is alert.  Sensation intact BLE with microfilaement  Skin: Skin is warm and dry.  Psychiatric: He has a normal mood and affect. His speech is normal and behavior is normal.  Vitals reviewed.      Assessment & Plan:   Problem List Items Addressed This Visit      Endocrine   Diabetes ([place])    No longer on metformin at this time.  Pending A1c.        Other   Elevated liver enzymes    History of, no follow-up last year.pending US abdomen, cmp.  Long discussion with wife and patient today, concern for fatty liver disease in context of elevated cholesterol, alcohol use.  Discussed lifestyle modification as treatment fatty liver disease.  Will await test however likely will consult GI.      Relevant Orders   US ABDOMEN LIMITED RUQ   Pain in both lower extremities    Presentation consistent with claudication.  H/o  Hyperlipidemia;  consult with vascular.      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  Referral to dermatology for further treatment.      Relevant Orders   Ambulatory referral to Dermatology   Other fatigue - Primary    Suspect multifactorial including poor sleep, lack of exercise.  Pending metabolic work-up as well.  Patient declines sleep study at this time.  Of note, Referral placed for urology as patient would like testosterone testing.      Relevant Orders   CBC with Differential/Platelet   Comprehensive metabolic panel   Hemoglobin A1c   Hepatitis C antibody   PSA   TSH   VITAMIN D 25 Hydroxy (Vit-D Deficiency, Fractures)   Ambulatory referral to Urology       I am having Troy Ellis maintain his Na Sulfate-K Sulfate-Mg Sulf, DULoxetine, diclofenac,  metFORMIN, and atorvastatin. We will continue to administer sodium chloride.   No orders of the defined types were placed in this encounter.   Return precautions given.   Risks, benefits, and alternatives of the medications and treatment plan prescribed today were discussed, and patient expressed understanding.   Education regarding symptom management and diagnosis given to patient on AVS.  Continue to follow with Burnard Hawthorne, FNP for routine health maintenance.   Troy Ellis and I agreed with plan.   Mable Paris, FNP

## 2018-05-09 NOTE — Assessment & Plan Note (Signed)
No longer on metformin at this time.  Pending A1c.

## 2018-05-09 NOTE — Assessment & Plan Note (Signed)
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  Referral to dermatology for further treatment.

## 2018-05-09 NOTE — Patient Instructions (Signed)
Labs today  Today we discussed referrals, orders.DERMATOLOGY, urology, Ultrasound liver, vascular   I have placed these orders in the system for you.  Please be sure to give [place] a call if you have not heard from our office regarding scheduling a test or regarding referral in a timely manner.  It is very important that you let me know as soon as possible.

## 2018-05-09 NOTE — Assessment & Plan Note (Signed)
Presentation consistent with claudication.  H/o  Hyperlipidemia;  consult with vascular.

## 2018-05-09 NOTE — Assessment & Plan Note (Signed)
Suspect multifactorial including poor sleep, lack of exercise.  Pending metabolic work-up as well.  Patient declines sleep study at this time.  Of note, Referral placed for urology as patient would like testosterone testing.

## 2018-05-09 NOTE — Assessment & Plan Note (Signed)
History of, no follow-up last year.pending US abdomen, cmp.  Long discussion with wife and patient today, concern for fatty liver disease in context of elevated cholesterol, alcohol use.  Discussed lifestyle modification as treatment fatty liver disease.  Will await test however likely will consult GI.

## 2018-05-11 LAB — HEPATITIS C ANTIBODY
HEP C AB: NONREACTIVE
SIGNAL TO CUT-OFF: 0.01 (ref <1.00)

## 2018-05-14 ENCOUNTER — Encounter: Payer: Self-pay | Admitting: Family

## 2018-05-20 ENCOUNTER — Telehealth: Payer: Self-pay | Admitting: Family

## 2018-05-20 DIAGNOSIS — E119 Type 2 diabetes mellitus without complications: Secondary | ICD-10-CM

## 2018-05-20 NOTE — Telephone Encounter (Signed)
Copied from [place] [id number]. Topic: Quick Communication - See Telephone Encounter >> May 20, 2018 12:36 PM Boyd Kerbs wrote: CRM for notification. See Telephone encounter for: 05/20/18.  Jenny Reichmann, wife called about medication.  Said nurse called them  about labs and said she was going to call in some medications and they have not received any.   [id number] [address], [place] [address] Phone: [phone] Fax: [phone]

## 2018-05-21 ENCOUNTER — Other Ambulatory Visit: Payer: Self-pay | Admitting: Family

## 2018-05-21 DIAGNOSIS — E119 Type 2 diabetes mellitus without complications: Secondary | ICD-10-CM

## 2018-05-21 MED ORDER — METFORMIN HCL ER 500 MG PO TB24: ORAL_TABLET | ORAL | 0 refills | Status: DC

## 2018-05-21 NOTE — Telephone Encounter (Signed)
Script called in for metformin , left message advising .

## 2018-05-21 NOTE — Telephone Encounter (Signed)
Per lab note: Notes recorded by Johna Sheriff, CMA on 05/16/2018 at 4:56 PM EDT Patient gave verbal permission for me to me to speak with wife . She was advised of lab results .  She states patient will need script called in for metformin. Will discuss moving up ultrasound sooner at follow up appointment . ------ / Please follow up and call in the metformin if appropriate.  See information below for pharmacy.

## 2018-06-06 ENCOUNTER — Ambulatory Visit: Payer: Managed Care, Other (non HMO) | Admitting: Family

## 2018-06-06 ENCOUNTER — Other Ambulatory Visit: Payer: Self-pay

## 2018-06-06 ENCOUNTER — Encounter: Payer: Self-pay | Admitting: Family

## 2018-06-06 VITALS — BP 124/82 | HR 44 | Temp 98.0°F | Wt 187.0 lb

## 2018-06-06 DIAGNOSIS — R748 Abnormal levels of other serum enzymes: Secondary | ICD-10-CM

## 2018-06-06 DIAGNOSIS — D696 Thrombocytopenia, unspecified: Secondary | ICD-10-CM

## 2018-06-06 DIAGNOSIS — E119 Type 2 diabetes mellitus without complications: Secondary | ICD-10-CM | POA: Diagnosis not present

## 2018-06-06 NOTE — Assessment & Plan Note (Signed)
Improved. Declines adjunct with victoza today. Will recheck a1c in 2 months.

## 2018-06-06 NOTE — Assessment & Plan Note (Signed)
Concern for liver disease. Discussed my suspicion that low platelets further evidence of . Pending ruq [place] and GI appointment; emphasized the importance of both. Will follow. Will consider hematology referral after seen by GI

## 2018-06-06 NOTE — Progress Notes (Signed)
Subjective:    Patient ID: Troy Ellis, male    DOB: [birth year], 57 y.o.   MRN: [id number]  CC: Troy Ellis is a 57 y.o. male who presents today for follow up.   HPI: Dm- started back on metformin, taking 1000mg . FBG 184. Pre prandial 174.   Has never been on insulin. In the past had good control on metformin alone. No personal or known history of thyroid cancer. Eating better, cut out bread.   Urology- appointment scheduled  GI- no appointment scheduled    RUQ [place] scheduled   Leg pain has improved.     HISTORY:  Past Medical History:  Diagnosis Date  . Arthritis    both hands  . Cataract    x2 surgeries  . Depression   . Diabetes mellitus without complication ([place])    controlled with diet and excercise  . GERD (gastroesophageal reflux disease)   . Hyperlipidemia    Past Surgical History:  Procedure Laterality Date  . EYE SURGERY     Lasic   Family History  Problem Relation Age of Onset  . Diabetes Brother   . Diabetes Brother   . Colon cancer Neg Hx     Allergies: Patient has no known allergies. Current Outpatient Medications on File Prior to Visit  Medication Sig Dispense Refill  . atorvastatin (LIPITOR) 80 MG tablet Take 1 tablet (80 mg total) by mouth daily. MUST KEEP APPT ON 05/09/18 FOR A 90 DAYS SUPPLY 15 tablet 0  . diclofenac (VOLTAREN) 75 MG EC tablet TAKE 1 TABLET BY MOUTH TWICE A DAY 60 tablet 0  . DULoxetine (CYMBALTA) 30 MG capsule TAKE 1 CAPSULE BY MOUTH 2 TIMES DAILY 60 capsule 3  . metFORMIN (GLUCOPHAGE-XR) 500 MG 24 hr tablet TAKE 1 TABLET BY MOUTH EVERY EVENING. 90 tablet 0  . Na Sulfate-K Sulfate-Mg Sulf 17.5-3.13-1.6 GM/180ML SOLN Suprep as directed.  No substitutions. 354 mL 0   Current Facility-Administered Medications on File Prior to Visit  Medication Dose Route Frequency Provider Last Rate Last Dose  . 0.9 %  sodium chloride infusion  500 mL Intravenous Continuous Milus Banister, MD        Social History   Tobacco Use   . Smoking status: Never Smoker  . Smokeless tobacco: Never Used  Substance Use Topics  . Alcohol use: No    Alcohol/week: 0.0 oz    Comment: occ  . Drug use: No    Review of Systems  Constitutional: Negative for chills and fever.  Respiratory: Negative for cough.   Cardiovascular: Negative for chest pain and palpitations.  Gastrointestinal: Negative for nausea and vomiting.      Objective:    BP 124/82 (BP Location: Left Arm, Patient Position: Sitting, Cuff Size: Normal)   Pulse (!) 44   Temp 98 F (36.7 C) (Oral)   Wt 187 lb (84.8 kg)   SpO2 96%   BMI 30.18 kg/m  BP Readings from Last 3 Encounters:  06/06/18 124/82  05/09/18 122/82  06/28/17 112/76   Wt Readings from Last 3 Encounters:  06/06/18 187 lb (84.8 kg)  05/09/18 193 lb (87.5 kg)  06/28/17 192 lb 2 oz (87.1 kg)    Physical Exam  Constitutional: He appears well-developed and well-nourished.  Cardiovascular: Regular rhythm and normal heart sounds.  Pulmonary/Chest: Effort normal and breath sounds normal. No respiratory distress. He has no wheezes. He has no rhonchi. He has no rales.  Lymphadenopathy:       Head (  left side): No submandibular and no preauricular adenopathy present.  Neurological: He is alert.  Skin: Skin is warm and dry.  Psychiatric: He has a normal mood and affect. His speech is normal and behavior is normal.  Vitals reviewed.      Assessment & Plan:   Problem List Items Addressed This Visit      Endocrine   Diabetes ([place]) - Primary    Improved. Declines adjunct with victoza today. Will recheck a1c in 2 months.       Relevant Orders   Ambulatory referral to Gastroenterology     Other   Elevated liver enzymes    Concern for liver disease. Discussed my suspicion that low platelets further evidence of . Pending ruq [place] and GI appointment; emphasized the importance of both. Will follow. Will consider hematology referral after seen by GI      Relevant Orders   Ambulatory  referral to Gastroenterology   Thrombocytopenia [facility])   Relevant Orders   Ambulatory referral to Gastroenterology       I am having Troy Ellis maintain his Na Sulfate-K Sulfate-Mg Sulf, DULoxetine, diclofenac, atorvastatin, and metFORMIN. We will continue to administer sodium chloride.   No orders of the defined types were placed in this encounter.   Return precautions given.   Risks, benefits, and alternatives of the medications and treatment plan prescribed today were discussed, and patient expressed understanding.   Education regarding symptom management and diagnosis given to patient on AVS.  Continue to follow with Burnard Hawthorne, FNP for routine health maintenance.   Troy Ellis and I agreed with plan.   Mable Paris, FNP

## 2018-06-06 NOTE — Patient Instructions (Addendum)
Increase metformin XL 1000mg  in evening - this is TWO tablets.   We need to see you in 3 months to recheck your sugar  We will call you with your appointment with GI- this is a very important appointment, please ensure we call you. Let me know if you dont hear from [place] in a one week

## 2018-06-10 ENCOUNTER — Other Ambulatory Visit: Payer: Self-pay | Admitting: Family

## 2018-06-10 DIAGNOSIS — E785 Hyperlipidemia, unspecified: Secondary | ICD-10-CM

## 2018-06-16 ENCOUNTER — Encounter: Payer: Self-pay | Admitting: *Deleted

## 2018-07-08 ENCOUNTER — Encounter: Payer: Self-pay | Admitting: Urology

## 2018-07-08 ENCOUNTER — Ambulatory Visit: Payer: Self-pay | Admitting: Urology

## 2018-07-11 ENCOUNTER — Encounter: Payer: Managed Care, Other (non HMO) | Admitting: Family

## 2018-07-11 ENCOUNTER — Other Ambulatory Visit: Payer: Self-pay | Admitting: Family

## 2018-07-11 DIAGNOSIS — E785 Hyperlipidemia, unspecified: Secondary | ICD-10-CM

## 2018-07-11 DIAGNOSIS — Z0289 Encounter for other administrative examinations: Secondary | ICD-10-CM

## 2018-08-07 ENCOUNTER — Ambulatory Visit: Payer: Managed Care, Other (non HMO)

## 2018-08-08 ENCOUNTER — Telehealth: Payer: Self-pay

## 2018-08-08 ENCOUNTER — Ambulatory Visit: Payer: Self-pay | Admitting: Urology

## 2018-08-08 ENCOUNTER — Ambulatory Visit: Payer: Managed Care, Other (non HMO) | Admitting: Nurse Practitioner

## 2018-08-08 ENCOUNTER — Other Ambulatory Visit: Payer: Managed Care, Other (non HMO)

## 2018-08-08 ENCOUNTER — Ambulatory Visit
Admission: RE | Admit: 2018-08-08 | Discharge: 2018-08-08 | Disposition: A | Discharge status: Home / Self Care | Payer: Managed Care, Other (non HMO) | Source: Ambulatory Visit | Attending: Family | Admitting: Family

## 2018-08-08 ENCOUNTER — Encounter: Payer: Self-pay | Admitting: Nurse Practitioner

## 2018-08-08 VITALS — BP 142/72 | HR 70 | Ht 66.0 in | Wt 193.2 lb

## 2018-08-08 DIAGNOSIS — R945 Abnormal results of liver function studies: Secondary | ICD-10-CM | POA: Diagnosis not present

## 2018-08-08 DIAGNOSIS — R748 Abnormal levels of other serum enzymes: Secondary | ICD-10-CM

## 2018-08-08 DIAGNOSIS — R7989 Other specified abnormal findings of blood chemistry: Secondary | ICD-10-CM

## 2018-08-08 DIAGNOSIS — R6882 Decreased libido: Secondary | ICD-10-CM

## 2018-08-08 NOTE — Telephone Encounter (Signed)
Copied from [place] [id number]. Topic: General - Other >> Aug 08, 2018  8:43 AM Keene Breath wrote: Reason for CRM: Sharyn Lull with [facility] called to inform the doctor that the referral he sent for patient regarding low testosterone cannot be completed because they have no record of labs or testosterone levels.  This should have been done by the PCP before the referral.  [facility] should  not have to do these labs.  Please advise.  CB# [phone].

## 2018-08-08 NOTE — Patient Instructions (Signed)
If you are age 56 or older, your body mass index should be between 23-30. Your Body mass index is 31.18 kg/m. If this is out of the aforementioned range listed, please consider follow up with your Primary Care Provider.  If you are age 42 or younger, your body mass index should be between 19-25. Your Body mass index is 31.18 kg/m. If this is out of the aformentioned range listed, please consider follow up with your Primary Care Provider.   Your provider has requested that you go to the basement level for lab work before leaving today. Press "B" on the elevator. The lab is located at the first door on the left as you exit the elevator.  LFT studies in three months:  November 07, 2018   NO Alcohol until appointment with me.  Call for an appointment the week of November 25th.  The schedule is not available at this time.  Thank you for choosing me and [facility].   [name], NP

## 2018-08-08 NOTE — Progress Notes (Signed)
P Primary GI:  Troy Caprice, MD    Chief Complaint:    Abnormal liver labs  Referring provider: Mable Paris FNP  IMPRESSION and PLAN:    #1. 57 yo male with history of mildly abnormal liver chemistries dating back to at least 2016.    -check viral / genetic / autoimmune markers today -no etoh for now until we can determine etiology of abnormal liver chemistries. Repeat liver labs in 3 months.  -await u/s results, it is scheduled for next Friday.  -he will follow up with me in 3-4 weeks. If labs normal consider liver biopsy  #2.  Hx of colon polyps [name] Feb 2018), on 3 year recall list.   HPI:     Patient is a 57 year old male with DM 2, hyperlipidemia on Lipitor.  He takes Cymbalta for history of depression. Patient had a screening colonoscopy here in February 2018.  Now being referred for abnormal liver chemistries.  His transaminases have been mildly elevated on a few occasions dating back to February 2016 (no labs in EMR prior to this).  Alkaline phosphatase was also mildly elevated at 131 in 2016, normal in 2017 and in late May 2019 it was up to 164.  His bilirubin is consistently normal.  PCP schedule patient for an ultrasound but he showed up to the appointment this morning not being n.p.o. so the test will need to be rescheduled.   Patient has no known [facility] of liver disease. Mother passed at childbirth. Patient drinks ETOH a few days out of the week but no more than 3 beers at a time. He takes ibuprofen for headaches but no more than once a month. No other nsaids. No tylenol. No vitamins / herbal supplements.  Patient says he feels fine. Weight is stable. He had a colonoscopy in in [place] around around age 56    Review of systems:     No chest pain, no SOB, no fevers, no urinary sx   Past Medical History:  Diagnosis Date  . Arthritis    both hands  . Cataract    x2 surgeries  . Depression   . Diabetes mellitus without complication ([place])    controlled with diet  and excercise  . GERD (gastroesophageal reflux disease)   . Hyperlipidemia     Patient's surgical history, family medical history, social history, medications and allergies were all reviewed in Epic   Creatinine clearance cannot be calculated (Patient's most recent lab result is older than the maximum 21 days allowed.)  Current Outpatient Medications  Medication Sig Dispense Refill  . atorvastatin (LIPITOR) 80 MG tablet TAKE 1 TABLET (80 MG TOTAL) BY MOUTH DAILY. 90 tablet 0  . DULoxetine (CYMBALTA) 30 MG capsule TAKE 1 CAPSULE BY MOUTH 2 TIMES DAILY 60 capsule 3  . metFORMIN (GLUCOPHAGE-XR) 500 MG 24 hr tablet TAKE 1 TABLET BY MOUTH EVERY EVENING. 90 tablet 0   Current Facility-Administered Medications  Medication Dose Route Frequency Provider Last Rate Last Dose  . 0.9 %  sodium chloride infusion  500 mL Intravenous Continuous Milus Banister, MD        Physical Exam:     BP (!) 142/72   Pulse 70   Ht 5\' 6"  (1.676 m)   Wt 193 lb 3.2 oz (87.6 kg)   BMI 31.18 kg/m   GENERAL:  Pleasant male in NAD PSYCH: : Cooperative, normal affect EENT:  conjunctiva pink, mucous membranes moist, neck supple without masses CARDIAC:  RRR, no murmur  heard, no peripheral edema PULM: Normal respiratory effort, lungs CTA bilaterally, no wheezing ABDOMEN:  Nondistended, soft, nontender. No obvious masses, no hepatomegaly,  normal bowel sounds SKIN:  turgor, no lesions seen Musculoskeletal:  Normal muscle tone, normal strength NEURO: Alert and oriented x 3, no focal neurologic deficits   [name] , NP 08/08/2018, 10:55 AM   Cc: Mable Paris, FNP

## 2018-08-08 NOTE — Telephone Encounter (Signed)
Copied from [place] [id number]. Topic: General - Other >> Aug 08, 2018  8:21 AM Judyann Munson wrote: Reason for CRM:  [facility] is calling to request another order, it expires tomorrow. Best contact number [phone]

## 2018-08-11 ENCOUNTER — Ambulatory Visit: Payer: Managed Care, Other (non HMO) | Admitting: Gastroenterology

## 2018-08-11 ENCOUNTER — Encounter: Payer: Self-pay | Admitting: Nurse Practitioner

## 2018-08-11 NOTE — Progress Notes (Signed)
I agree with the above note, plan 

## 2018-08-11 NOTE — Addendum Note (Signed)
Addended by: Burnard Hawthorne on: 08/11/2018 04:21 PM   Modules accepted: Orders

## 2018-08-11 NOTE — Telephone Encounter (Signed)
Appointment scheduled.

## 2018-08-11 NOTE — Telephone Encounter (Signed)
Call pt  Testosterone has been ordered via lab  He needs to come at 8 am and be fasting for this to ensure accuracy

## 2018-08-14 LAB — ANA: Anti Nuclear Antibody(ANA): POSITIVE — AB

## 2018-08-14 LAB — ANTI-SMOOTH MUSCLE ANTIBODY, IGG: Actin (Smooth Muscle) Antibody (IGG): <20 U (ref <20)

## 2018-08-14 LAB — ANTI-NUCLEAR AB-TITER (ANA TITER): ANA Titer 1: 1:640 {titer} — ABNORMAL HIGH

## 2018-08-14 LAB — HEPATITIS A ANTIBODY, TOTAL: Hepatitis A AB,Total: REACTIVE — AB

## 2018-08-14 LAB — CERULOPLASMIN: CERULOPLASMIN: 34 mg/dL (ref 18–36)

## 2018-08-14 LAB — HEPATITIS B SURFACE ANTIBODY,QUALITATIVE: HEP B S AB: NONREACTIVE

## 2018-08-14 LAB — HEPATITIS C ANTIBODY
Hepatitis C Ab: NONREACTIVE
SIGNAL TO CUT-OFF: 0.01 (ref <1.00)

## 2018-08-14 LAB — MITOCHONDRIAL ANTIBODIES

## 2018-08-14 LAB — ALPHA-1-ANTITRYPSIN: A-1 Antitrypsin, Ser: 135 mg/dL (ref 83–199)

## 2018-08-14 LAB — HEPATITIS B SURFACE ANTIGEN: Hepatitis B Surface Ag: NONREACTIVE

## 2018-08-22 ENCOUNTER — Other Ambulatory Visit: Payer: Managed Care, Other (non HMO)

## 2018-09-08 ENCOUNTER — Other Ambulatory Visit: Payer: Self-pay | Admitting: Family

## 2018-09-08 DIAGNOSIS — M542 Cervicalgia: Secondary | ICD-10-CM

## 2018-09-09 ENCOUNTER — Other Ambulatory Visit: Payer: Self-pay

## 2018-09-09 DIAGNOSIS — R945 Abnormal results of liver function studies: Secondary | ICD-10-CM

## 2018-09-09 DIAGNOSIS — R7989 Other specified abnormal findings of blood chemistry: Secondary | ICD-10-CM

## 2018-09-12 ENCOUNTER — Ambulatory Visit: Payer: Managed Care, Other (non HMO) | Admitting: Family

## 2018-09-28 ENCOUNTER — Other Ambulatory Visit: Payer: Self-pay | Admitting: Family

## 2018-09-28 DIAGNOSIS — M542 Cervicalgia: Secondary | ICD-10-CM

## 2018-10-06 ENCOUNTER — Other Ambulatory Visit: Payer: Self-pay

## 2018-10-06 DIAGNOSIS — R7989 Other specified abnormal findings of blood chemistry: Secondary | ICD-10-CM

## 2018-10-06 DIAGNOSIS — R945 Abnormal results of liver function studies: Secondary | ICD-10-CM

## 2018-10-13 ENCOUNTER — Other Ambulatory Visit: Payer: Self-pay | Admitting: Family

## 2018-10-13 DIAGNOSIS — E119 Type 2 diabetes mellitus without complications: Secondary | ICD-10-CM

## 2018-10-29 ENCOUNTER — Telehealth: Payer: Self-pay

## 2018-10-29 DIAGNOSIS — R748 Abnormal levels of other serum enzymes: Secondary | ICD-10-CM

## 2018-10-29 DIAGNOSIS — E119 Type 2 diabetes mellitus without complications: Secondary | ICD-10-CM

## 2018-10-29 NOTE — Telephone Encounter (Signed)
Call pt and wife  Pt had told me he was taking 1000mg  so that would be two tablets. Please send in correct rx  However he is due for f/u and repeat a1c. Please schedule an appt asap

## 2018-10-29 NOTE — Telephone Encounter (Signed)
Copied from [place] [id number]. Topic: General - Other >> Oct 29, 2018  9:28 AM Keene Breath wrote: Reason for CRM: Patient's wife called to get clarification on patient's Metformin medication.  Patient stated that the doctor told him to take 2 pills a day and when he picked up the prescription, it says 1 pill a day.  Therefore, the patient will run out of pills of he takes it 2 pills a day.  Please clarify and advise.  CB# Caren Griffins at [phone]

## 2018-10-29 NOTE — Telephone Encounter (Signed)
Copied from [place] [phone]. Topic: General - Other >> Oct 29, 2018  9:28 AM Keene Breath wrote: Reason for CRM: Patient's wife called to get clarification on patient's Metformin medication.  Patient stated that the doctor told him to take 2 pills a day and when he picked up the prescription, it says 1 pill a day.  Therefore, the patient will run out of pills of he takes it 2 pills a day.  Please clarify and advise.  CB# Caren Griffins at [phone]

## 2018-10-30 MED ORDER — METFORMIN HCL ER 500 MG PO TB24: ORAL_TABLET | ORAL | 0 refills | Status: DC

## 2018-10-30 NOTE — Addendum Note (Signed)
Addended by: Johna Sheriff on: 10/30/2018 09:27 AM   Modules accepted: Orders

## 2018-10-30 NOTE — Telephone Encounter (Signed)
Called and spoke with patients wife Troy Ellis and patient is taking metformin XR 500 mg 2 tablets daily.   Advised her that I sent in correct rx.   Also advised that patient is due for follow up , she states patient can only come in on Friday for appointments . Scheduled for 11/07/18.   She also wants to know if patient can be re-scheduled for ultrasound on Friday 11/07/18.  It looks like order has expired I have pended the order for you to sign off on .   He would like to have ultrasound on day of appointment on 11/07/18.

## 2018-10-30 NOTE — Addendum Note (Signed)
Addended by: Johna Sheriff on: 10/30/2018 09:15 AM   Modules accepted: Orders

## 2018-10-31 NOTE — Telephone Encounter (Signed)
Order signed off on.

## 2018-10-31 NOTE — Telephone Encounter (Signed)
Pt's wife states she spoke the scheduler at [facility] and they do not have an updated order for pt's u/s. Please re-send.

## 2018-10-31 NOTE — Telephone Encounter (Signed)
Please advise 

## 2018-10-31 NOTE — Addendum Note (Signed)
Addended by: Johna Sheriff on: 10/31/2018 12:37 PM   Modules accepted: Orders

## 2018-11-07 ENCOUNTER — Encounter: Payer: Self-pay | Admitting: Family

## 2018-11-07 ENCOUNTER — Ambulatory Visit: Payer: Managed Care, Other (non HMO) | Admitting: Family

## 2018-11-07 ENCOUNTER — Ambulatory Visit
Admission: RE | Admit: 2018-11-07 | Discharge: 2018-11-07 | Disposition: A | Discharge status: Home / Self Care | Payer: Managed Care, Other (non HMO) | Source: Ambulatory Visit | Attending: Family | Admitting: Family

## 2018-11-07 VITALS — BP 124/78 | HR 65 | Temp 98.1°F | Resp 15 | Ht 66.0 in | Wt 195.8 lb

## 2018-11-07 DIAGNOSIS — M545 Low back pain, unspecified: Secondary | ICD-10-CM

## 2018-11-07 DIAGNOSIS — K760 Fatty (change of) liver, not elsewhere classified: Secondary | ICD-10-CM

## 2018-11-07 DIAGNOSIS — E119 Type 2 diabetes mellitus without complications: Secondary | ICD-10-CM | POA: Diagnosis not present

## 2018-11-07 DIAGNOSIS — R748 Abnormal levels of other serum enzymes: Secondary | ICD-10-CM

## 2018-11-07 DIAGNOSIS — M79604 Pain in right leg: Secondary | ICD-10-CM | POA: Diagnosis not present

## 2018-11-07 DIAGNOSIS — M79605 Pain in left leg: Secondary | ICD-10-CM

## 2018-11-07 LAB — COMPREHENSIVE METABOLIC PANEL
ALBUMIN: 4.4 g/dL (ref 3.5–5.2)
ALK PHOS: 127 U/L — AB (ref 39–117)
ALT: 42 U/L (ref 0–53)
AST: 27 U/L (ref 0–37)
BILIRUBIN TOTAL: 0.6 mg/dL (ref 0.2–1.2)
BUN: 19 mg/dL (ref 6–23)
CALCIUM: 9 mg/dL (ref 8.4–10.5)
CO2: 31 mEq/L (ref 19–32)
Chloride: 104 mEq/L (ref 96–112)
Creatinine, Ser: 0.85 mg/dL (ref 0.40–1.50)
GFR: 99.03 mL/min (ref >60.00)
Glucose, Bld: 291 mg/dL — ABNORMAL HIGH (ref 70–99)
Potassium: 4 mEq/L (ref 3.5–5.1)
Sodium: 141 mEq/L (ref 135–145)
TOTAL PROTEIN: 6.8 g/dL (ref 6.0–8.3)

## 2018-11-07 LAB — HEMOGLOBIN A1C: HEMOGLOBIN A1C: 7.9 % — AB (ref 4.6–6.5)

## 2018-11-07 NOTE — Assessment & Plan Note (Signed)
Reviewed RUS [place] with patient. Discussed fatty liver lifestyle modifications including low trans fat diet, maintaining a healthy weight. Emphasized importance of keeping cholesterol, BP, and A1c at goal. Will continue to follow. Advised to stay established with GI for surveillance.

## 2018-11-07 NOTE — Patient Instructions (Addendum)
Trial of taking lipitor 3 times per week and see if  If leg pain improves. If not, let me know and we consult vascular.   Please ensure you go back to GI to have specialized labs done for liver and you maintain this relationship so that watch your fatty liver disease and monitor for progression, liver cancer .  We will do basic labs today.   Refrain from alcohol.    As discussed, lifestyle modifications are the treatment for this -  including low trans fat diet, maintaining a healthy weight as well as keeping cholesterol, blood pressure, and diabetes at goal.   We will recheck you lipid panel ( this is a fasting lab) at your next visit to see if your LDL has decreased to goal of less than 70. If not, I would advise to increase your cholesterol medication.   We will also do further liver function labs at that time.   Nonalcoholic Fatty Liver Disease Diet Introduction Nonalcoholic fatty liver disease is a condition that causes fat to accumulate in and around the liver. The disease makes it harder for the liver to work the way that it should. Following a healthy diet can help to keep nonalcoholic fatty liver disease under control. It can also help to prevent or improve conditions that are associated with the disease, such as heart disease, diabetes, high blood pressure, and abnormal cholesterol levels. Along with regular exercise, this diet:  Promotes weight loss.  Helps to control blood sugar levels.  Helps to improve the way that the body uses insulin. What do I need to know about this diet?  Use the glycemic index (GI) to plan your meals. The index tells you how quickly a food will raise your blood sugar. Choose low-GI foods. These foods take a longer time to raise blood sugar.  Keep track of how many calories you take in. Eating the right amount of calories will help you to achieve a healthy weight.  You may want to follow a Mediterranean diet. This diet includes a lot of vegetables,  lean meats or fish, whole grains, fruits, and healthy oils and fats. What foods can I eat? Grains  Whole grains, such as whole-wheat or whole-grain breads, crackers, tortillas, cereals, and pasta. Stone-ground whole wheat. Pumpernickel bread. Unsweetened oatmeal. Bulgur. Barley. Quinoa. Brown or wild rice. Corn or whole-wheat flour tortillas. Vegetables  Lettuce. Spinach. Peas. Beets. Cauliflower. Cabbage. Broccoli. Carrots. Tomatoes. Squash. Eggplant. Herbs. Peppers. Onions. Cucumbers. Brussels sprouts. Yams and sweet potatoes. Beans. Lentils. Fruits  Bananas. Apples. Oranges. Grapes. Papaya. Mango. Pomegranate. Kiwi. Grapefruit. Cherries. Meats and Other Protein Sources  Seafood and shellfish. Lean meats. Poultry. Tofu. Dairy  Low-fat or fat-free dairy products, such as yogurt, cottage cheese, and cheese. Beverages  Water. Sugar-free drinks. Tea. Coffee. Low-fat or skim milk. Milk alternatives, such as soy or almond milk. Real fruit juice. Condiments  Mustard. Relish. Low-fat, low-sugar ketchup and barbecue sauce. Low-fat or fat-free mayonnaise. Sweets and Desserts  Sugar-free sweets. Fats and Oils  Avocado. Canola or olive oil. Nuts and nut butters. Seeds. The items listed above may not be a complete list of recommended foods or beverages. Contact your dietitian for more options.  What foods are not recommended? Palm oil and coconut oil. Processed foods. Fried foods. Sweetened drinks, such as sweet tea, milkshakes, snow cones, iced sweet drinks, and sodas. Alcohol. Sweets. Foods that contain a lot of salt or sodium. The items listed above may not be a complete list of foods and  beverages to avoid. Contact your dietitian for more information.  This information is not intended to replace advice given to you by your health care provider. Make sure you discuss any questions you have with your health care provider. Document Released: 04/19/2015 Document Revised: 05/10/2016 Document Reviewed:  12/28/2014  2017 Elsevier  Fatty Liver Introduction  Fatty liver, also called hepatic steatosis or steatohepatitis, is a condition in which too much fat has built up in your liver cells. The liver removes harmful substances from your bloodstream. It produces fluids your body needs. It also helps your body use and store energy from the food you eat. In many cases, fatty liver does not cause symptoms or problems. It is often diagnosed when tests are being done for other reasons. However, over time, fatty liver can cause inflammation that may lead to more serious liver problems, such as scarring of the liver (cirrhosis). What are the causes? Causes of fatty liver may include:  Drinking too much alcohol.  Poor nutrition.  Obesity.  Cushing syndrome.  Diabetes.  Hyperlipidemia.  Pregnancy.  Certain drugs.  Poisons.  Some viral infections. What increases the risk? You may be more likely to develop fatty liver if you:  Abuse alcohol.  Are pregnant.  Are overweight.  Have diabetes.  Have hepatitis.  Have a high triglyceride level. What are the signs or symptoms? Fatty liver often does not cause any symptoms. In cases where symptoms develop, they can include:  Fatigue.  Weakness.  Weight loss.  Confusion.  Abdominal pain.  Yellowing of your skin and the white parts of your eyes (jaundice).  Nausea and vomiting. How is this diagnosed? Fatty liver may be diagnosed by:  Physical exam and medical history.  Blood tests.  Imaging tests, such as an ultrasound, CT scan, or MRI.  Liver biopsy. A small sample of liver tissue is removed using a needle. The sample is then looked at under a microscope. How is this treated? Fatty liver is often caused by other health conditions. Treatment for fatty liver may involve medicines and lifestyle changes to manage conditions such as:  Alcoholism.  High cholesterol.  Diabetes.  Being overweight or obese. Follow  these instructions at home:  Eat a healthy diet as directed by your health care provider.  Exercise regularly. This can help you lose weight and control your cholesterol and diabetes. Talk to your health care provider about an exercise plan and which activities are best for you.  Do not drink alcohol.  Take medicines only as directed by your health care provider. Contact a health care provider if: You have difficulty controlling your:  Blood sugar.  Cholesterol.  Alcohol consumption. Get help right away if:  You have abdominal pain.  You have jaundice.  You have nausea and vomiting. This information is not intended to replace advice given to you by your health care provider. Make sure you discuss any questions you have with your health care provider.

## 2018-11-07 NOTE — Assessment & Plan Note (Signed)
Suspect improved. Pending a1c

## 2018-11-07 NOTE — Progress Notes (Signed)
Subjective:    Patient ID: Troy Ellis, male    DOB: Feb 26, 1961, 57 y.o.   MRN: [id number]  CC: Troy Ellis is a 57 y.o. male who presents today for follow up.   HPI: Accompanied with wife today  DM- FBG 140 , on metformin.   HLD- compliant with lipitor.   Depression has improved; and back pain has resolved on cymbalta.   Still complains of occasional pain in calves when walking . Improves when sitting. No numbness in legs. No sob, swelling, rash.   No longer drinking alcohol.      HISTORY:  Past Medical History:  Diagnosis Date  . Arthritis    both hands  . Cataract    x2 surgeries  . Depression   . Diabetes mellitus without complication ([place])    controlled with diet and excercise  . GERD (gastroesophageal reflux disease)   . Hyperlipidemia    Past Surgical History:  Procedure Laterality Date  . EYE SURGERY     Lasic   Family History  Problem Relation Age of Onset  . Diabetes Brother   . Diabetes Brother   . Colon cancer Neg Hx     Allergies: Patient has no known allergies. Current Outpatient Medications on File Prior to Visit  Medication Sig Dispense Refill  . atorvastatin (LIPITOR) 80 MG tablet TAKE 1 TABLET (80 MG TOTAL) BY MOUTH DAILY. 90 tablet 0  . DULoxetine (CYMBALTA) 30 MG capsule TAKE 1 CAPSULE BY MOUTH 2 TIMES DAILY 180 capsule 1  . metFORMIN (GLUCOPHAGE-XR) 500 MG 24 hr tablet TAKE 2 TABLET BY MOUTH EVERY DAY IN THE EVENING 180 tablet 0   Current Facility-Administered Medications on File Prior to Visit  Medication Dose Route Frequency Provider Last Rate Last Dose  . 0.9 %  sodium chloride infusion  500 mL Intravenous Continuous Troy Banister, MD        Social History   Tobacco Use  . Smoking status: Never Smoker  . Smokeless tobacco: Never Used  Substance Use Topics  . Alcohol use: No    Alcohol/week: 0.0 standard drinks    Comment: occ  . Drug use: No    Review of Systems  Constitutional: Negative for chills and  fever.  Respiratory: Negative for cough and shortness of breath.   Cardiovascular: Negative for chest pain, palpitations and leg swelling.  Gastrointestinal: Negative for nausea and vomiting.  Musculoskeletal: Negative for back pain (resolved).      Objective:    BP 124/78 (BP Location: Left Arm, Patient Position: Sitting, Cuff Size: Large)   Pulse 65   Temp 98.1 F (36.7 C) (Oral)   Resp 15   Ht 5\' 6"  (1.676 m)   Wt 195 lb 12.8 oz (88.8 kg)   SpO2 96%   BMI 31.60 kg/m  BP Readings from Last 3 Encounters:  11/07/18 124/78  08/08/18 (!) 142/72  06/06/18 124/82   Wt Readings from Last 3 Encounters:  11/07/18 195 lb 12.8 oz (88.8 kg)  08/08/18 193 lb 3.2 oz (87.6 kg)  06/06/18 187 lb (84.8 kg)    Physical Exam  Constitutional: He appears well-developed and well-nourished.  Cardiovascular: Regular rhythm and normal heart sounds.  No LE edema, palpable cords or masses. No erythema or increased warmth. No asymmetry in calf size when compared bilaterally LE hair growth symmetric and present.   varicosities noted. LE warm and palpable pedal pulses.   Pulmonary/Chest: Effort normal and breath sounds normal. No respiratory distress. He has  no wheezes. He has no rhonchi. He has no rales.  Neurological: He is alert.  Skin: Skin is warm and dry.  Psychiatric: He has a normal mood and affect. His speech is normal and behavior is normal.  Vitals reviewed.      Assessment & Plan:   Problem List Items Addressed This Visit      Digestive   Fatty liver    Reviewed RUS [place] with patient. Discussed fatty liver lifestyle modifications including low trans fat diet, maintaining a healthy weight. Emphasized importance of keeping cholesterol, BP, and A1c at goal. Will continue to follow. Advised to stay established with GI for surveillance.         Endocrine   Diabetes ([place]) - Primary    Suspect improved. Pending a1c      Relevant Orders   Hemoglobin A1c     Other   Acute  midline low back pain without sciatica    Resolved. Will follow. Continue cymbalta      Pain in both lower extremities    Patient did not go to vascular in the past. Advised trial of lipitor 3x per week; if no improvement, emphasized again he would need to see vascular.           I am having Troy Ellis maintain his atorvastatin, DULoxetine, and metFORMIN. We will continue to administer sodium chloride.   No orders of the defined types were placed in this encounter.   Return precautions given.   Risks, benefits, and alternatives of the medications and treatment plan prescribed today were discussed, and patient expressed understanding.   Education regarding symptom management and diagnosis given to patient on AVS.  Continue to follow with Troy Hawthorne, FNP for routine health maintenance.   Troy Ellis and I agreed with plan.   Troy Paris, FNP

## 2018-11-07 NOTE — Assessment & Plan Note (Signed)
Patient did not go to vascular in the past. Advised trial of lipitor 3x per week; if no improvement, emphasized again he would need to see vascular.

## 2018-11-07 NOTE — Assessment & Plan Note (Signed)
Resolved. Will follow. Continue cymbalta

## 2018-11-12 ENCOUNTER — Other Ambulatory Visit: Payer: Self-pay | Admitting: Family

## 2018-11-12 DIAGNOSIS — R748 Abnormal levels of other serum enzymes: Secondary | ICD-10-CM

## 2018-11-12 NOTE — Progress Notes (Signed)
close

## 2018-11-17 ENCOUNTER — Telehealth: Payer: Self-pay

## 2018-11-17 NOTE — Telephone Encounter (Signed)
-----   Message from Willia Craze, NP sent at 11/14/2018  2:23 PM EST ----- Eustaquio Maize, PCP contacted me. She is concerned that patient never followed up with [place]. Will you call and ask him to come in for the labs that I previously ordered? Also, I should see him back in follow up . Thanks

## 2018-11-17 NOTE — Telephone Encounter (Signed)
Patient not available. Death in his family. Spoke with Ms. Marcotte. Appointment scheduled for 12/12/18 at 9am. She will have the patient come in for the labs before the appointment.

## 2018-11-28 ENCOUNTER — Telehealth: Payer: Self-pay | Admitting: Family

## 2018-11-28 NOTE — Telephone Encounter (Signed)
Call patient I have been speaking with the nurse practitioner Nevin Bloodgood whom he will see gastroenterology this month.  I just want to make him aware is a history of  autoimmune antibodies being positive.    If GI dosenot find autoimmune GI disease, I would recommend that he see rheumatology.  Patients with a positive ANA as he has can sometimes have autoimmune diseases such as rheumatoid arthritis.  This could explain some of his joint pain.  If he would like to see rheumatology in either case, I am happy to place referral now.  Please let me know

## 2018-11-28 NOTE — Telephone Encounter (Signed)
Left message to return call. Ok for pec to discuss.  

## 2018-11-28 NOTE — Telephone Encounter (Signed)
Pt's wife called regarding the message that was left for the pt. The patient has given verbal consent for [place] to speak with her.  Wife given the message from Mable Paris regarding his ANA result. They would like to keep the appointment with gastroenterology for now. Is going to wait on seeing rheumatology now.

## 2018-11-28 NOTE — Telephone Encounter (Signed)
Just FYI.

## 2018-12-01 NOTE — Telephone Encounter (Signed)
noted 

## 2018-12-12 ENCOUNTER — Other Ambulatory Visit: Payer: Self-pay

## 2018-12-12 ENCOUNTER — Ambulatory Visit (INDEPENDENT_AMBULATORY_CARE_PROVIDER_SITE_OTHER): Payer: Managed Care, Other (non HMO) | Admitting: Vascular Surgery

## 2018-12-12 ENCOUNTER — Encounter: Payer: Self-pay | Admitting: Nurse Practitioner

## 2018-12-12 ENCOUNTER — Encounter (INDEPENDENT_AMBULATORY_CARE_PROVIDER_SITE_OTHER): Payer: Self-pay | Admitting: Vascular Surgery

## 2018-12-12 ENCOUNTER — Ambulatory Visit: Payer: Managed Care, Other (non HMO) | Admitting: Nurse Practitioner

## 2018-12-12 ENCOUNTER — Other Ambulatory Visit (INDEPENDENT_AMBULATORY_CARE_PROVIDER_SITE_OTHER): Payer: Self-pay | Admitting: Vascular Surgery

## 2018-12-12 ENCOUNTER — Ambulatory Visit (INDEPENDENT_AMBULATORY_CARE_PROVIDER_SITE_OTHER): Payer: Managed Care, Other (non HMO)

## 2018-12-12 VITALS — BP 117/76 | HR 50 | Resp 16 | Ht 64.5 in | Wt 194.0 lb

## 2018-12-12 VITALS — BP 114/70 | HR 60 | Ht 64.57 in | Wt 195.2 lb

## 2018-12-12 DIAGNOSIS — M1990 Unspecified osteoarthritis, unspecified site: Secondary | ICD-10-CM

## 2018-12-12 DIAGNOSIS — M79604 Pain in right leg: Secondary | ICD-10-CM | POA: Diagnosis not present

## 2018-12-12 DIAGNOSIS — E119 Type 2 diabetes mellitus without complications: Secondary | ICD-10-CM

## 2018-12-12 DIAGNOSIS — I83813 Varicose veins of bilateral lower extremities with pain: Secondary | ICD-10-CM

## 2018-12-12 DIAGNOSIS — R945 Abnormal results of liver function studies: Secondary | ICD-10-CM

## 2018-12-12 DIAGNOSIS — R7989 Other specified abnormal findings of blood chemistry: Secondary | ICD-10-CM

## 2018-12-12 DIAGNOSIS — M79605 Pain in left leg: Secondary | ICD-10-CM

## 2018-12-12 NOTE — Assessment & Plan Note (Signed)
Likely multifactorial though venous insufficiency is likely playing a major contributing role or a primary role.

## 2018-12-12 NOTE — Assessment & Plan Note (Signed)
Likely a contributing factor to his lower extremity symptoms.  Does have a Baker's cyst on the right.

## 2018-12-12 NOTE — Patient Instructions (Signed)
If you are age 56 or older, your body mass index should be between 23-30. Your Body mass index is 32.93 kg/m. If this is out of the aforementioned range listed, please consider follow up with your Primary Care Provider.  If you are age 82 or younger, your body mass index should be between 19-25. Your Body mass index is 32.93 kg/m. If this is out of the aformentioned range listed, please consider follow up with your Primary Care Provider.   Your provider has requested that you go to the basement level for lab work [place]. Press "B" on the elevator. The lab is located at the first door on the left as you exit the elevator. March (end of March)  We will call you with results.   Thank you for choosing me and [facility].   [name], NP

## 2018-12-12 NOTE — Progress Notes (Signed)
I agree with the above note, plan 

## 2018-12-12 NOTE — Progress Notes (Signed)
Chief Complaint:    Abnormal liver studies  IMPRESSION and PLAN:    57 yo male with chronically abnormal liver tests. HBV/ HAV autoimmune / genetic markers all negative.  Ultrasound raises concern for steatosis.  At the time of last labs 11/07/2018 liver were all normal with the exception of mildly elevated alkaline phos of 127.  -I asked patient to continue to refrain from alcohol for the time being -We discussed fatty liver disease and the need for better glucose control and weight loss.  He will work on weight loss.  Sounds like PCP will soon be adding another medication to help better control blood sugars.  -Repeat liver test in 3 months.  At that time I am going to add in a ferritin and TIBC just to rule out hemochromatosis.  -regarding strongly positive ANA, autoimmune liver disease seems unlikely at this point.  If his liver test do continue to fluctuate, will consider liver biopsy . Patient has significant joint pains and is to see Rheumatology soon   HPI:     Patient is a 57 year old male who late August of this year for evaluation of abnormal liver studies.  He has a history of DM 2, hyperlipidemia.  His liver tests have been mildly elevated over the preceding couple of years.  When I saw the patient in August I ordered labs for viral/genetic/autoimmune etiologies of liver disease.  I advised him to discontinue all alcohol during the process of evaluation.  He was having an ultrasound the following Friday and was to follow back up with me in 3 to 4 weeks.  Patient did not return for follow-up.  PCP recently contacted me about getting the patient back in for follow-up. His ceruloplasmin, alpha anti-1 antitrypsin, AMA, ASMA were all normal.  His ANA with positive, titer 1: 640  Recent labs drawn 11/07/2018 show alkaline phosphatase down to 127 from 164 in late May.  Remaining liver test normals.  Patient has not had any alcohol since August when I saw him in clinic though it  does not sound like he has been drinker in years.   Patient has no GI complaints.  Sounds like PCP will be adding a new medication soon to help better manage diabetes.  Patient does admit to significant joint pain especially in his shoulders and knees.  Wife says PCP is sending him to rheumatology soon.  He is also seeing a vein specialist this morning for varicose veins with pain.   Review of systems:     No chest pain, no SOB, no fevers, no urinary sx   Past Medical History:  Diagnosis Date  . Arthritis    both hands  . Cataract    x2 surgeries  . Depression   . Diabetes mellitus without complication ([place])    controlled with diet and excercise  . GERD (gastroesophageal reflux disease)   . Hyperlipidemia     Patient's surgical history, family medical history, social history, medications and allergies were all reviewed in Epic   Creatinine clearance cannot be calculated (Patient's most recent lab result is older than the maximum 21 days allowed.)  Current Outpatient Medications  Medication Sig Dispense Refill  . atorvastatin (LIPITOR) 80 MG tablet TAKE 1 TABLET (80 MG TOTAL) BY MOUTH DAILY. 90 tablet 0  . DULoxetine (CYMBALTA) 30 MG capsule TAKE 1 CAPSULE BY MOUTH 2 TIMES DAILY 180 capsule 1  . metFORMIN (GLUCOPHAGE-XR) 500 MG 24 hr tablet TAKE 2 TABLET BY MOUTH  EVERY DAY IN THE EVENING 180 tablet 0   Current Facility-Administered Medications  Medication Dose Route Frequency Provider Last Rate Last Dose  . 0.9 %  sodium chloride infusion  500 mL Intravenous Continuous Milus Banister, MD        Physical Exam:     BP 114/70 (BP Location: Left Arm, Patient Position: Sitting, Cuff Size: Normal)   Pulse 60   Ht 5' 4.57" (1.64 m)   Wt 195 lb 4 oz (88.6 kg)   BMI 32.93 kg/m   GENERAL:  Pleasant male in NAD PSYCH: : Cooperative, normal affect EENT:  conjunctiva pink, mucous membranes moist, neck supple without masses CARDIAC:  RRR, nomurmur heard, no peripheral edema PULM:  Normal respiratory effort, lungs CTA bilaterally, no wheezing ABDOMEN:  Nondistended, soft, nontender. No obvious masses, no hepatomegaly,  normal bowel sounds SKIN:  turgor, no lesions seen Musculoskeletal:  Normal muscle tone, normal strength NEURO: Alert and oriented x 3, no focal neurologic deficits   [name] , NP 12/12/2018, 9:21 AM

## 2018-12-12 NOTE — Patient Instructions (Signed)

## 2018-12-12 NOTE — Assessment & Plan Note (Signed)
Noninvasive studies performed today showed long segment great saphenous vein reflux bilaterally without DVT or superficial thrombophlebitis. We had a long discussion today regarding the natural history and pathophysiology of venous disease.  I have prescribed him compression stockings today and recommended that he wear these daily starting first thing in the morning.  He should elevate his legs and take anti-inflammatories as needed for discomfort.  He will do this for several months, and we will reassess his symptoms after the use of conservative therapy.

## 2018-12-12 NOTE — Progress Notes (Signed)
Patient ID: Troy Ellis, male   DOB: April 08, 1961, 57 y.o.   MRN: [id number]  Chief Complaint  Patient presents with  . Varicose Veins    HPI Troy Ellis is a 57 y.o. male.  I am asked to see the patient by M. Arnett, FNP for evaluation of leg pain.  The patient reports tiredness and heaviness in his legs most every day.  This has been present for many years without a clear cause or inciting event.  This has gradually worsened over time until he sought treatment through his primary care physician several months ago.  He reports no history of DVT or superficial thrombophlebitis.  The right leg is the more severely affected of the 2 legs, but both legs do bother him.  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  Swelling is present intermittently.  No ulceration or infection.  Nothing has really helped out symptoms.  He cannot pinpoint any specific thing that makes it worse other than being on his feet.  Noninvasive studies performed today showed long segment great saphenous vein reflux bilaterally without DVT or superficial thrombophlebitis.   Past Medical History:  Diagnosis Date  . Arthritis    both hands  . Cataract    x2 surgeries  . Depression   . Diabetes mellitus without complication ([place])    controlled with diet and excercise  . GERD (gastroesophageal reflux disease)   . Hyperlipidemia     Past Surgical History:  Procedure Laterality Date  . EYE SURGERY     Lasic    Family History  Problem Relation Age of Onset  . Diabetes Brother   . Diabetes Brother   . Colon cancer Neg Hx   no bleeding or clotting disorders No aneurysms  Social History Social History   Tobacco Use  . Smoking status: Never Smoker  . Smokeless tobacco: Never Used  Substance Use Topics  . Alcohol use: No    Alcohol/week: 0.0 standard drinks    Comment: occ  . Drug use: No    No Known Allergies  Current  Outpatient Medications  Medication Sig Dispense Refill  . atorvastatin (LIPITOR) 80 MG tablet TAKE 1 TABLET (80 MG TOTAL) BY MOUTH DAILY. 90 tablet 0  . DULoxetine (CYMBALTA) 30 MG capsule TAKE 1 CAPSULE BY MOUTH 2 TIMES DAILY 180 capsule 1  . metFORMIN (GLUCOPHAGE-XR) 500 MG 24 hr tablet TAKE 2 TABLET BY MOUTH EVERY DAY IN THE EVENING 180 tablet 0   Current Facility-Administered Medications  Medication Dose Route Frequency Provider Last Rate Last Dose  . 0.9 %  sodium chloride infusion  500 mL Intravenous Continuous Milus Banister, MD          REVIEW OF SYSTEMS (Negative unless checked)  Constitutional: [] Weight loss  [] Fever  [] Chills Cardiac: [] Chest pain   [] Chest pressure   [] Palpitations   [] Shortness of breath when laying flat   [] Shortness of breath at rest   [] Shortness of breath with exertion. Vascular:  [x] Pain in legs with walking   [x] Pain in legs at rest   [] Pain in legs when laying flat   [] Claudication   [] Pain in feet when walking  [] Pain in feet at rest  [] Pain in feet when laying flat   [] History of DVT   [] Phlebitis   [x] Swelling in legs   [] Varicose veins   [] Non-healing ulcers Pulmonary:   [] Uses home  oxygen   [] Productive cough   [] Hemoptysis   [] Wheeze  [] COPD   [] Asthma Neurologic:  [] Dizziness  [] Blackouts   [] Seizures   [] History of stroke   [] History of TIA  [] Aphasia   [] Temporary blindness   [] Dysphagia   [] Weakness or numbness in arms   [x] Weakness or numbness in legs Musculoskeletal:  [x] Arthritis   [] Joint swelling   [] Joint pain   [] Low back pain Hematologic:  [] Easy bruising  [] Easy bleeding   [] Hypercoagulable state   [] Anemic  [] Hepatitis Gastrointestinal:  [] Blood in stool   [] Vomiting blood  [] Gastroesophageal reflux/heartburn   [] Abdominal pain Genitourinary:  [] Chronic kidney disease   [] Difficult urination  [] Frequent urination  [] Burning with urination   [] Hematuria Skin:  [] Rashes   [] Ulcers   [] Wounds Psychological:  [] History of anxiety   []   History of major depression.    Physical Exam BP 117/76 (BP Location: Left Arm, Patient Position: Sitting)   Pulse (!) 50   Resp 16   Ht 5' 4.5" (1.638 m)   Wt 194 lb (88 kg)   BMI 32.79 kg/m  Gen:  WD/WN, NAD Head: [place]/AT, No temporalis wasting.  Ear/Nose/Throat: Hearing grossly intact, nares w/o erythema or drainage, oropharynx w/o Erythema/Exudate Eyes: Conjunctiva clear, sclera non-icteric  Neck: trachea midline.   Pulmonary:  Good air movement, respirations are not labored, no use of accessory muscles Cardiac: RRR, no JVD Vascular:  Vessel Right Left  Radial Palpable Palpable                          PT  1+ palpable  1+ palpable  DP  1+ palpable  2+ palpable   Gastrointestinal: soft, non-tender/non-distended.  Musculoskeletal: M/S 5/5 throughout.  Extremities without ischemic changes.  No deformity or atrophy.  No significant lower extremity edema today.  Scattered varicosities bilaterally Neurologic: Sensation grossly intact in extremities.  Symmetrical.  Speech is fluent. Motor exam as listed above. Psychiatric: Judgment intact, Mood & affect appropriate for pt's clinical situation. Dermatologic: No rashes or ulcers noted.  No cellulitis or open wounds.    Radiology No results found.  Labs Recent Results (from the past 2160 hour(s))  Comprehensive metabolic panel     Status: Abnormal   Collection Time: 11/07/18 12:27 PM  Result Value Ref Range   Sodium 141 135 - 145 mEq/L   Potassium 4.0 3.5 - 5.1 mEq/L   Chloride 104 96 - 112 mEq/L   CO2 31 19 - 32 mEq/L   Glucose, Bld 291 (H) 70 - 99 mg/dL   BUN 19 6 - 23 mg/dL   Creatinine, Ser 0.85 0.40 - 1.50 mg/dL   Total Bilirubin 0.6 0.2 - 1.2 mg/dL   Alkaline Phosphatase 127 (H) 39 - 117 U/L   AST 27 0 - 37 U/L   ALT 42 0 - 53 U/L   Total Protein 6.8 6.0 - 8.3 g/dL   Albumin 4.4 3.5 - 5.2 g/dL   Calcium 9.0 8.4 - 10.5 mg/dL   GFR 99.03 >60.00 mL/min  Hemoglobin A1c     Status: Abnormal   Collection Time:  11/07/18 12:27 PM  Result Value Ref Range   Hgb A1c MFr Bld 7.9 (H) 4.6 - 6.5 %    Comment: Glycemic Control Guidelines for People with Diabetes:Non Diabetic:  <6%Goal of Therapy: <7%Additional Action Suggested:  >8%     Assessment/Plan:  Arthritis Likely a contributing factor to his lower extremity symptoms.  Does have a Baker's cyst  on the right.  Diabetes ([place]) blood glucose control important in reducing the progression of atherosclerotic disease. Also, involved in wound healing. On appropriate medications.  Could have a component of lower extremity neuropathy present as well.  Pain in both lower extremities Likely multifactorial though venous insufficiency is likely playing a major contributing role or a primary role.  Varicose veins of leg with pain, bilateral Noninvasive studies performed today showed long segment great saphenous vein reflux bilaterally without DVT or superficial thrombophlebitis. We had a long discussion today regarding the natural history and pathophysiology of venous disease.  I have prescribed him compression stockings today and recommended that he wear these daily starting first thing in the morning.  He should elevate his legs and take anti-inflammatories as needed for discomfort.  He will do this for several months, and we will reassess his symptoms after the use of conservative therapy.      Leotis Pain 12/12/2018, 12:17 PM   This note was created with Dragon medical transcription system.  Any errors from dictation are unintentional.

## 2018-12-12 NOTE — Assessment & Plan Note (Signed)
blood glucose control important in reducing the progression of atherosclerotic disease. Also, involved in wound healing. On appropriate medications.  Could have a component of lower extremity neuropathy present as well.

## 2018-12-14 ENCOUNTER — Other Ambulatory Visit: Payer: Self-pay | Admitting: Family

## 2018-12-14 DIAGNOSIS — E785 Hyperlipidemia, unspecified: Secondary | ICD-10-CM

## 2018-12-17 HISTORY — PX: OTHER SURGICAL HISTORY: SHX169

## 2019-01-10 ENCOUNTER — Other Ambulatory Visit: Payer: Self-pay | Admitting: Family

## 2019-01-10 DIAGNOSIS — E119 Type 2 diabetes mellitus without complications: Secondary | ICD-10-CM

## 2019-02-06 ENCOUNTER — Encounter: Payer: Self-pay | Admitting: Family

## 2019-02-06 ENCOUNTER — Other Ambulatory Visit (INDEPENDENT_AMBULATORY_CARE_PROVIDER_SITE_OTHER): Payer: Managed Care, Other (non HMO)

## 2019-02-06 ENCOUNTER — Ambulatory Visit (INDEPENDENT_AMBULATORY_CARE_PROVIDER_SITE_OTHER): Payer: Managed Care, Other (non HMO) | Admitting: Family

## 2019-02-06 VITALS — BP 118/76 | HR 60 | Temp 98.0°F | Ht 65.75 in | Wt 198.6 lb

## 2019-02-06 DIAGNOSIS — E119 Type 2 diabetes mellitus without complications: Secondary | ICD-10-CM | POA: Diagnosis not present

## 2019-02-06 DIAGNOSIS — Z0000 Encounter for general adult medical examination without abnormal findings: Secondary | ICD-10-CM

## 2019-02-06 DIAGNOSIS — I83813 Varicose veins of bilateral lower extremities with pain: Secondary | ICD-10-CM | POA: Diagnosis not present

## 2019-02-06 DIAGNOSIS — K760 Fatty (change of) liver, not elsewhere classified: Secondary | ICD-10-CM | POA: Diagnosis not present

## 2019-02-06 DIAGNOSIS — Z23 Encounter for immunization: Secondary | ICD-10-CM | POA: Diagnosis not present

## 2019-02-06 NOTE — Assessment & Plan Note (Signed)
Declines prostate exam in the absence of symptoms today.  Agreed to monitor PSA.  Screening labs ordered.

## 2019-02-06 NOTE — Assessment & Plan Note (Signed)
Pending A1c 

## 2019-02-06 NOTE — Addendum Note (Signed)
Addended by: Leeanne Rio on: 02/06/2019 03:23 PM   Modules accepted: Orders

## 2019-02-06 NOTE — Patient Instructions (Addendum)
Ensure you have a follow up with [facility]. Please call to schedule a follow up with them-Call [phone] to make an appointment.  May go ahead and start taking one tablet Cymbalta a day for 4-5 days, then may start taking every other day, and do this for 4-5 days, then may stop.   Labs later today Fatty Liver Disease  Fatty liver disease occurs when too much fat has built up in your liver cells. Fatty liver disease is also called hepatic steatosis or steatohepatitis. The liver removes harmful substances from your bloodstream and produces fluids that your body needs. It also helps your body use and store energy from the food you eat. In many cases, fatty liver disease does not cause symptoms or problems. It is often diagnosed when tests are being done for other reasons. However, over time, fatty liver can cause inflammation that may lead to more serious liver problems, such as scarring of the liver (cirrhosis) and liver failure. Fatty liver is associated with insulin resistance, increased body fat, high blood pressure (hypertension), and high cholesterol. These are features of metabolic syndrome and increase your risk for stroke, diabetes, and heart disease. What are the causes? This condition may be caused by:  Drinking too much alcohol.  Poor nutrition.  Obesity.  Cushing's syndrome.  Diabetes.  High cholesterol.  Certain drugs.  Poisons.  Some viral infections.  Pregnancy. What increases the risk? You are more likely to develop this condition if you:  Abuse alcohol.  Are overweight.  Have diabetes.  Have hepatitis.  Have a high triglyceride level.  Are pregnant. What are the signs or symptoms? Fatty liver disease often does not cause symptoms. If symptoms do develop, they can include:  Fatigue.  Weakness.  Weight loss.  Confusion.  Abdominal pain.  Nausea and vomiting.  Yellowing of your skin and the white parts of your eyes (jaundice).  Itchy  skin. How is this diagnosed? This condition may be diagnosed by:  A physical exam and medical history.  Blood tests.  Imaging tests, such as an ultrasound, CT scan, or MRI.  A liver biopsy. A small sample of liver tissue is removed using a needle. The sample is then looked at under a microscope. How is this treated? Fatty liver disease is often caused by other health conditions. Treatment for fatty liver may involve medicines and lifestyle changes to manage conditions such as:  Alcoholism.  High cholesterol.  Diabetes.  Being overweight or obese. Follow these instructions at home:   Do not drink alcohol. If you have trouble quitting, ask your health care provider how to safely quit with the help of medicine or a supervised program. This is important to keep your condition from getting worse.  Eat a healthy diet as told by your health care provider. Ask your health care provider about working with a diet and nutrition specialist (dietitian) to develop an eating plan.  Exercise regularly. This can help you lose weight and control your cholesterol and diabetes. Talk to your health care provider about an exercise plan and which activities are best for you.  Take over-the-counter and prescription medicines only as told by your health care provider.  Keep all follow-up visits as told by your health care provider. This is important. Contact a health care provider if: You have trouble controlling your:  Blood sugar. This is especially important if you have diabetes.  Cholesterol.  Drinking of alcohol. Get help right away if:  You have abdominal pain.  You  have jaundice.  You have nausea and vomiting.  You vomit blood or material that looks like coffee grounds.  You have stools that are black, tar-like, or bloody. Summary  Fatty liver disease develops when too much fat builds up in the cells of your liver.  Fatty liver disease often causes no symptoms or problems.  However, over time, fatty liver can cause inflammation that may lead to more serious liver problems, such as scarring of the liver (cirrhosis).  You are more likely to develop this condition if you abuse alcohol, are pregnant, are overweight, have diabetes, have hepatitis, or have high triglyceride levels.  Contact your health care provider if you have trouble controlling your weight, blood sugar, cholesterol, or drinking of alcohol. This information is not intended to replace advice given to you by your health care provider. Make sure you discuss any questions you have with your health care provider. Document Released: 01/18/2006 Document Revised: 09/11/2017 Document Reviewed: 09/11/2017 Elsevier Interactive Patient Education  2019 [place] Maintenance, Male A healthy lifestyle and preventive care is important for your health and wellness. Ask your health care provider about what schedule of regular examinations is right for you. What should I know about weight and diet? Eat a Healthy Diet  Eat plenty of vegetables, fruits, whole grains, low-fat dairy products, and lean protein.  Do not eat a lot of foods high in solid fats, added sugars, or salt.  Maintain a Healthy Weight Regular exercise can help you achieve or maintain a healthy weight. You should:  Do at least 150 minutes of exercise each week. The exercise should increase your heart rate and make you sweat (moderate-intensity exercise).  Do strength-training exercises at least twice a week. Watch Your Levels of Cholesterol and Blood Lipids  Have your blood tested for lipids and cholesterol every 5 years starting at 56 years of age. If you are at high risk for heart disease, you should start having your blood tested when you are 57 years old. You may need to have your cholesterol levels checked more often if: ? Your lipid or cholesterol levels are high. ? You are older than 57 years of age. ? You are at high risk for  heart disease. What should I know about cancer screening? Many types of cancers can be detected early and may often be prevented. Lung Cancer  You should be screened every year for lung cancer if: ? You are a current smoker who has smoked for at least 30 years. ? You are a former smoker who has quit within the past 15 years.  Talk to your health care provider about your screening options, when you should start screening, and how often you should be screened. Colorectal Cancer  Routine colorectal cancer screening usually begins at 56 years of age and should be repeated every 5-10 years until you are 57 years old. You may need to be screened more often if early forms of precancerous polyps or small growths are found. Your health care provider may recommend screening at an earlier age if you have risk factors for colon cancer.  Your health care provider may recommend using home test kits to check for hidden blood in the stool.  A small camera at the end of a tube can be used to examine your colon (sigmoidoscopy or colonoscopy). This checks for the earliest forms of colorectal cancer. Prostate and Testicular Cancer  Depending on your age and overall health, your health care provider may do certain  tests to screen for prostate and testicular cancer.  Talk to your health care provider about any symptoms or concerns you have about testicular or prostate cancer. Skin Cancer  Check your skin from head to toe regularly.  Tell your health care provider about any new moles or changes in moles, especially if: ? There is a change in a mole's size, shape, or color. ? You have a mole that is larger than a pencil eraser.  Always use sunscreen. Apply sunscreen liberally and repeat throughout the day.  Protect yourself by wearing long sleeves, pants, a wide-brimmed hat, and sunglasses when outside. What should I know about heart disease, diabetes, and high blood pressure?  If you are 18-39 years of  age, have your blood pressure checked every 3-5 years. If you are 83 years of age or older, have your blood pressure checked every year. You should have your blood pressure measured twice-once when you are at a hospital or clinic, and once when you are not at a hospital or clinic. Record the average of the two measurements. To check your blood pressure when you are not at a hospital or clinic, you can use: ? An automated blood pressure machine at a pharmacy. ? A home blood pressure monitor.  Talk to your health care provider about your target blood pressure.  If you are between 1-63 years old, ask your health care provider if you should take aspirin to prevent heart disease.  Have regular diabetes screenings by checking your fasting blood sugar level. ? If you are at a normal weight and have a low risk for diabetes, have this test once every three years after the age of 19. ? If you are overweight and have a high risk for diabetes, consider being tested at a younger age or more often.  A one-time screening for abdominal aortic aneurysm (AAA) by ultrasound is recommended for men aged 6-75 years who are current or former smokers. What should I know about preventing infection? Hepatitis B If you have a higher risk for hepatitis B, you should be screened for this virus. Talk with your health care provider to find out if you are at risk for hepatitis B infection. Hepatitis C Blood testing is recommended for:  Everyone born from [birth year] through 1964.  Anyone with known risk factors for hepatitis C. Sexually Transmitted Diseases (STDs)  You should be screened each year for STDs including gonorrhea and chlamydia if: ? You are sexually active and are younger than 57 years of age. ? You are older than 57 years of age and your health care provider tells you that you are at risk for this type of infection. ? Your sexual activity has changed since you were last screened and you are at an increased risk  for chlamydia or gonorrhea. Ask your health care provider if you are at risk.  Talk with your health care provider about whether you are at high risk of being infected with HIV. Your health care provider may recommend a prescription medicine to help prevent HIV infection. What else can I do?  Schedule regular health, dental, and eye exams.  Stay current with your vaccines (immunizations).  Do not use any tobacco products, such as cigarettes, chewing tobacco, and e-cigarettes. If you need help quitting, ask your health care provider.  Limit alcohol intake to no more than 2 drinks per day. One drink equals 12 ounces of beer, 5 ounces of wine, or 1 ounces of hard liquor.  Do not  use street drugs.  Do not share needles.  Ask your health care provider for help if you need support or information about quitting drugs.  Tell your health care provider if you often feel depressed.  Tell your health care provider if you have ever been abused or do not feel safe at home. This information is not intended to replace advice given to you by your health care provider. Make sure you discuss any questions you have with your health care provider. Document Released: 05/31/2008 Document Revised: 08/01/2016 Document Reviewed: 09/06/2015 Elsevier Interactive Patient Education  2019 [employer].

## 2019-02-06 NOTE — Addendum Note (Signed)
Addended by: Tressie Ellis E on: 02/06/2019 11:19 AM   Modules accepted: Orders

## 2019-02-06 NOTE — Progress Notes (Signed)
Subjective:    Patient ID: Troy Ellis, male    DOB: 03-Dec-1961, 58 y.o.   MRN: [id number]  CC: Troy Ellis is a 59 y.o. male who presents today for physical exam.    HPI: Accompanied by daughter today.  Doing well today, no complaints  HLD- compliant with medication.    Dm-compliant with medication.  Not checking blood sugars  Leg pain is not improved on cymbalta.  Has follow-up scheduled with Dr. dew.  has seen gastroenterology regards abnormal liver tests 11/2018. Plan to repeat liver studies in 3 months. Has seen vascular, Dr. Lucky Cowboy 11/2018, in regards to varicose veins, pain from venous insufficiency. Colorectal  Cancer Screening: UTD , due 2021 Prostate Cancer Screening: No trouble urinating, hesitancy.  Lung Cancer Screening: No 30 year pack year history and > 55 years.  Immunizations       Tetanus - due        Pneumococcal - due  Labs: Screening labs today. 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   Alcohol use: None Smoking/tobacco use: Nonsmoker.  Regular dental exams:UTD Wears seat belt: Yes. Skin: no new lesions  HISTORY:  Past Medical History:  Diagnosis Date  . Arthritis    both hands  . Cataract    x2 surgeries  . Depression   . Diabetes mellitus without complication ([place])    controlled with diet and excercise  . GERD (gastroesophageal reflux disease)   . Hyperlipidemia     Past Surgical History:  Procedure Laterality Date  . EYE SURGERY     Lasic   Family History  Problem Relation Age of Onset  . Diabetes Brother   . Diabetes Brother   . Colon cancer Neg Hx   . Prostate cancer Neg Hx       ALLERGIES: Patient has no known allergies.  Current Outpatient Medications on File Prior to Visit  Medication Sig Dispense Refill  . atorvastatin (LIPITOR) 80 MG tablet TAKE 1 TABLET (80 MG TOTAL) BY MOUTH DAILY. 90 tablet 0  . DULoxetine (CYMBALTA) 30 MG capsule TAKE 1 CAPSULE BY MOUTH 2 TIMES DAILY 180 capsule 1  .  metFORMIN (GLUCOPHAGE-XR) 500 MG 24 hr tablet TAKE 1 TABLET BY MOUTH EVERY DAY IN THE EVENING 90 tablet 0   Current Facility-Administered Medications on File Prior to Visit  Medication Dose Route Frequency Provider Last Rate Last Dose  . 0.9 %  sodium chloride infusion  500 mL Intravenous Continuous Milus Banister, MD        Social History   Tobacco Use  . Smoking status: Never Smoker  . Smokeless tobacco: Never Used  Substance Use Topics  . Alcohol use: No    Alcohol/week: 0.0 standard drinks    Comment: occ  . Drug use: No    Review of Systems  Constitutional: Negative for chills and fever.  HENT: Negative for congestion.   Respiratory: Negative for cough.   Cardiovascular: Negative for chest pain, palpitations and leg swelling.  Gastrointestinal: Negative for diarrhea, nausea and vomiting.  Genitourinary: Negative for difficulty urinating, discharge, flank pain and penile pain.  Musculoskeletal: Positive for arthralgias (legs). Negative for myalgias.  Skin: Negative for rash.  Neurological: Negative for headaches.  Hematological: Negative for adenopathy.  Psychiatric/Behavioral: Negative for confusion.      Objective:    BP 118/76 (BP Location: Left Arm, Patient Position: Sitting, Cuff Size: Large)   Pulse 60   Temp 98 F (36.7 C)   Ht 5' 5.75" (  1.67 m)   Wt 198 lb 9.6 oz (90.1 kg)   SpO2 98%   BMI 32.30 kg/m   BP Readings from Last 3 Encounters:  02/06/19 118/76  12/12/18 117/76  12/12/18 114/70   Wt Readings from Last 3 Encounters:  02/06/19 198 lb 9.6 oz (90.1 kg)  12/12/18 194 lb (88 kg)  12/12/18 195 lb 4 oz (88.6 kg)    Physical Exam Vitals signs reviewed.  Constitutional:      Appearance: He is well-developed.  Neck:     Thyroid: No thyroid mass or thyromegaly.  Cardiovascular:     Rate and Rhythm: Regular rhythm.     Heart sounds: Normal heart sounds.  Pulmonary:     Effort: Pulmonary effort is normal. No respiratory distress.     Breath  sounds: Normal breath sounds. No wheezing, rhonchi or rales.  Lymphadenopathy:     Head:     Right side of head: No submental, submandibular, tonsillar, preauricular, posterior auricular or occipital adenopathy.     Left side of head: No submental, submandibular, tonsillar, preauricular, posterior auricular or occipital adenopathy.     Cervical: No cervical adenopathy.  Skin:    General: Skin is warm and dry.  Neurological:     Mental Status: He is alert.  Psychiatric:        Speech: Speech normal.        Behavior: Behavior normal.        Assessment & Plan:   Problem List Items Addressed This Visit      Cardiovascular and Mediastinum   Varicose veins of leg with pain, bilateral    Chronic, pain is largely unchanged.  He will continue to follow with vascular.  Weaning off the Cymbalta as has not been been very effective.  Will follow        Digestive   Fatty liver    Again discussed fatty liver disease, progression, surveillance, lifestyle modifications today.  Emphasized the importance of continued establishment with GI for routine monitoring, surveillance.  He will call make a follow-up appointment with them.  Will follow        Endocrine   Diabetes ([place])    Pending A1c        Other   Routine physical examination - Primary    Declines prostate exam in the absence of symptoms today.  Agreed to monitor PSA.  Screening labs ordered.      Relevant Orders   CBC with Differential/Platelet   TSH   Comprehensive metabolic panel   Hemoglobin A1c   Lipid panel   VITAMIN D 25 Hydroxy (Vit-D Deficiency, Fractures)   PSA       I am having Troy Ellis maintain his DULoxetine, atorvastatin, and metFORMIN. We will continue to administer sodium chloride.   No orders of the defined types were placed in this encounter.   Return precautions given.   Risks, benefits, and alternatives of the medications and treatment plan prescribed today were discussed, and patient  expressed understanding.   Education regarding symptom management and diagnosis given to patient on AVS.   Continue to follow with Burnard Hawthorne, FNP for routine health maintenance.   Shawnee Knapp and I agreed with plan.   Mable Paris, FNP

## 2019-02-06 NOTE — Assessment & Plan Note (Signed)
Again discussed fatty liver disease, progression, surveillance, lifestyle modifications today.  Emphasized the importance of continued establishment with GI for routine monitoring, surveillance.  He will call make a follow-up appointment with them.  Will follow

## 2019-02-06 NOTE — Assessment & Plan Note (Signed)
Chronic, pain is largely unchanged.  He will continue to follow with vascular.  Weaning off the Cymbalta as has not been been very effective.  Will follow

## 2019-02-07 LAB — COMPREHENSIVE METABOLIC PANEL
AG RATIO: 1.8 (calc) (ref 1.0–2.5)
ALT: 40 U/L (ref 9–46)
AST: 25 U/L (ref 10–35)
Albumin: 4.7 g/dL (ref 3.6–5.1)
Alkaline phosphatase (APISO): 124 U/L (ref 35–144)
BUN: 14 mg/dL (ref 7–25)
CO2: 28 mmol/L (ref 20–32)
Calcium: 9.9 mg/dL (ref 8.6–10.3)
Chloride: 102 mmol/L (ref 98–110)
Creat: 0.79 mg/dL (ref 0.70–1.33)
Globulin: 2.6 g/dL (calc) (ref 1.9–3.7)
Glucose, Bld: 212 mg/dL — ABNORMAL HIGH (ref 65–99)
Potassium: 4.3 mmol/L (ref 3.5–5.3)
SODIUM: 140 mmol/L (ref 135–146)
Total Bilirubin: 0.5 mg/dL (ref 0.2–1.2)
Total Protein: 7.3 g/dL (ref 6.1–8.1)

## 2019-02-07 LAB — LIPID PANEL
Cholesterol: 133 mg/dL (ref <200)
HDL: 54 mg/dL (ref >=40)
LDL Cholesterol (Calc): 58 mg/dL (calc)
Non-HDL Cholesterol (Calc): 79 mg/dL (calc) (ref <130)
Total CHOL/HDL Ratio: 2.5 (calc) (ref <5.0)
Triglycerides: 120 mg/dL (ref <150)

## 2019-02-07 LAB — CBC WITH DIFFERENTIAL/PLATELET
Absolute Monocytes: 300 cells/uL (ref 200–950)
BASOS ABS: 19 {cells}/uL (ref 0–200)
Basophils Relative: 0.5 %
Eosinophils Absolute: 99 cells/uL (ref 15–500)
Eosinophils Relative: 2.6 %
HCT: 44.2 % (ref 38.5–50.0)
Hemoglobin: 15.3 g/dL (ref 13.2–17.1)
Lymphs Abs: 1216 cells/uL (ref 850–3900)
MCH: 30.6 pg (ref 27.0–33.0)
MCHC: 34.6 g/dL (ref 32.0–36.0)
MCV: 88.4 fL (ref 80.0–100.0)
MONOS PCT: 7.9 %
MPV: 12.7 fL — ABNORMAL HIGH (ref 7.5–12.5)
Neutro Abs: 2166 cells/uL (ref 1500–7800)
Neutrophils Relative %: 57 %
Platelets: 165 10*3/uL (ref 140–400)
RBC: 5 10*6/uL (ref 4.20–5.80)
RDW: 13.1 % (ref 11.0–15.0)
Total Lymphocyte: 32 %
WBC: 3.8 10*3/uL (ref 3.8–10.8)

## 2019-02-07 LAB — HEMOGLOBIN A1C
Hgb A1c MFr Bld: 8.5 % of total Hgb — ABNORMAL HIGH (ref <5.7)
Mean Plasma Glucose: 197 (calc)
eAG (mmol/L): 10.9 (calc)

## 2019-02-07 LAB — PSA: PSA: 0.1 ng/mL (ref <=4.0)

## 2019-02-07 LAB — TSH: TSH: 1.27 mIU/L (ref 0.40–4.50)

## 2019-02-07 LAB — VITAMIN D 25 HYDROXY (VIT D DEFICIENCY, FRACTURES): Vit D, 25-Hydroxy: 22 ng/mL — ABNORMAL LOW (ref 30–100)

## 2019-02-09 ENCOUNTER — Other Ambulatory Visit: Payer: Self-pay | Admitting: Family

## 2019-02-09 DIAGNOSIS — E119 Type 2 diabetes mellitus without complications: Secondary | ICD-10-CM

## 2019-02-09 MED ORDER — METFORMIN HCL ER (MOD) 1000 MG PO TB24: ORAL_TABLET | ORAL | 1 refills | Status: DC

## 2019-02-10 ENCOUNTER — Other Ambulatory Visit: Payer: Self-pay

## 2019-02-10 ENCOUNTER — Telehealth: Payer: Self-pay | Admitting: Family

## 2019-02-10 NOTE — Telephone Encounter (Signed)
Pt's wife questioning if forms she brought in last week were faxed. States for insurance. Wants to check on status.

## 2019-02-10 NOTE — Telephone Encounter (Signed)
I tried to call available numbers for patient, but was unable to get an answer. Insurance form was faxed, but without Troy Ellis's signature. I will have her sign tomorrow & fax them to [facility]. I also additionally received fax from them that will also be signed tomorrow when Troy Ellis is back in the office.

## 2019-02-11 ENCOUNTER — Other Ambulatory Visit: Payer: Self-pay | Admitting: Family

## 2019-02-11 MED ORDER — METFORMIN HCL ER 500 MG PO TB24: 1000.0000 mg | ORAL_TABLET | Freq: Every day | ORAL | 3 refills | Status: DC

## 2019-02-11 NOTE — Telephone Encounter (Signed)
Troy Ellis tried to call available numbers in patient's chart & was unable to reach him or his wife. I LM on patient's cell that paperwork was faxed & to please call back our office.

## 2019-03-13 ENCOUNTER — Telehealth: Payer: Self-pay | Admitting: Family

## 2019-03-13 ENCOUNTER — Ambulatory Visit (INDEPENDENT_AMBULATORY_CARE_PROVIDER_SITE_OTHER): Payer: Managed Care, Other (non HMO) | Admitting: Vascular Surgery

## 2019-03-13 ENCOUNTER — Other Ambulatory Visit: Payer: Self-pay | Admitting: Family

## 2019-03-13 DIAGNOSIS — M542 Cervicalgia: Secondary | ICD-10-CM

## 2019-03-13 DIAGNOSIS — E785 Hyperlipidemia, unspecified: Secondary | ICD-10-CM

## 2019-03-13 NOTE — Telephone Encounter (Signed)
dced cymbalta from chart ; we dc/ed during ov 02/06/19  Sarah, please call pharmacy and ensure cymbalta is dc/ed

## 2019-03-13 NOTE — Telephone Encounter (Signed)
I have called d/c with [facility] in [place].

## 2019-03-19 ENCOUNTER — Telehealth: Payer: Self-pay | Admitting: Family Medicine

## 2019-03-19 NOTE — Telephone Encounter (Signed)
Please call to set up appt in May or June 2020 -- due for diabetes follow up  Needs:  A1c  Urine microalbumin  Opthalmology exam for diabetics

## 2019-03-20 NOTE — Telephone Encounter (Signed)
I spoke with patient & he is scheduled fir diabetes f/u 05/29/19.

## 2019-04-24 ENCOUNTER — Encounter (INDEPENDENT_AMBULATORY_CARE_PROVIDER_SITE_OTHER): Payer: Self-pay | Admitting: Vascular Surgery

## 2019-04-24 ENCOUNTER — Ambulatory Visit (INDEPENDENT_AMBULATORY_CARE_PROVIDER_SITE_OTHER): Payer: Managed Care, Other (non HMO) | Admitting: Vascular Surgery

## 2019-04-24 VITALS — BP 136/71 | HR 50 | Resp 16 | Wt 194.0 lb

## 2019-04-24 DIAGNOSIS — M1990 Unspecified osteoarthritis, unspecified site: Secondary | ICD-10-CM

## 2019-04-24 DIAGNOSIS — E785 Hyperlipidemia, unspecified: Secondary | ICD-10-CM | POA: Insufficient documentation

## 2019-04-24 DIAGNOSIS — M79605 Pain in left leg: Secondary | ICD-10-CM | POA: Diagnosis not present

## 2019-04-24 DIAGNOSIS — I83813 Varicose veins of bilateral lower extremities with pain: Secondary | ICD-10-CM

## 2019-04-24 DIAGNOSIS — E119 Type 2 diabetes mellitus without complications: Secondary | ICD-10-CM

## 2019-04-24 DIAGNOSIS — M79604 Pain in right leg: Secondary | ICD-10-CM

## 2019-04-24 NOTE — Patient Instructions (Signed)
Nonsurgical Procedures for Varicose Veins Various nonsurgical procedures can be used to treat varicose veins. Varicose veins are swollen, twisted veins that are visible under the skin. They occur most often in the legs. These veins may appear blue and bulging. Varicose veins are caused by damage to the valves in veins. All veins have a valve that makes blood flow in only one direction. If a valve gets weak or damaged, blood can pool and cause varicose veins. You may need a procedure to treat your varicose veins if they are causing symptoms or complications, or if lifestyle changes have not helped. These procedures can reduce pain, aching, and the risk of bleeding and blood clots. They can also improve the way the affected area looks (cosmetic appearance). The three common nonsurgical procedures are:  Sclerotherapy. A chemical is injected to close off a vein.  Laser treatment. Light energy is applied to close off the vein.  Radiofrequency vein ablation. Electrical energy is used to produce heat that closes off the vein. Your health care provider will discuss the method that is best for you based on your condition. Tell a health care provider about:  Any allergies you have.  All medicines you are taking, including vitamins, herbs, eye drops, creams, and over-the-counter medicines.  Any problems you or family members have had with anesthetic medicines.  Any blood disorders you have.  Any surgeries you have had.  Any medical conditions you have.  Whether you are pregnant or may be pregnant. What are the risks? Generally, this is a safe procedure. However, problems may occur, including:  Damage to nearby nerves, tissues, or veins.  Skin irritation, sores, or dark spots.  Numbness.  Clotting.  Infection.  Allergic reactions to medicines.  Scarring.  Leg swelling.  Need for additional treatments.  Bruising. What happens before the procedure?  Ask your health care provider  about: ? Changing or stopping your regular medicines. This is especially important if you are taking diabetes medicines or blood thinners. ? Taking over-the-counter medicines, vitamins, herbs, and supplements. ? Taking medicines such as aspirin and ibuprofen. These medicines can thin your blood. Do not take these medicines unless your health care provider tells you to take them.  You may have an exam or testing. This can include a tests to: ? Check for clots and check blood flow using sound waves (Doppler ultrasound). ? Observe how blood flows through your veins by injecting a dye that outlines your veins on X-rays (angiogram). This test is used in rare cases. What happens during the procedure? One of the following procedures will be performed: Sclerotherapy This procedure is often used for small to medium veins.  A chemical (sclerosant) that irritates the lining of the vein will be injected into the vein. This will cause the varicose vein to be closed off. Sclerosants in different amounts and strengths can be used, depending on the size and location of the vein.  All of the varicose vein sites will be injected. You may need more than one treatment because new varicose veins may develop, or more than one injection may be needed for each varicose vein.  Laser treatment There are two ways that lasers are used to treat varicose veins:  Light energy from a laser may be directed onto the vein through the skin.  A needle may be used to pass a thin laser catheter into the vein to cause it to close. You may need more than one treatment if the vein re-opens. In some cases,   laser treatment may be combined with sclerotherapy. Radiofrequency vein ablation   You will be given a medicine that numbs the area (local anesthetic).  A small incision will be made near the varicose vein.  A thin tube (catheter) will be threaded into your vein.  The tip of the catheter will deploy electrodes.  The  electrodes will deliver electrical energy to produce heat that closes off the vein. What happens after the procedure?  A bandage (dressing) may be used to cover the injection site or incisions.  You may have to wear compression stockings. These stockings help to prevent blood clots and reduce swelling in your legs.  Return to your normal activities as told by your health care provider. Summary  Varicose veins are swollen, twisted veins that are visible under the skin. They occur most often in the legs.  Various procedures can be used to treat varicose veins. You may need a procedure to treat your varicose veins if they are causing symptoms or complications, or if lifestyle changes have not helped.  Your health care provider will discuss the method that is best for you based on your condition. This information is not intended to replace advice given to you by your health care provider. Make sure you discuss any questions you have with your health care provider. Document Released: 03/15/2017 Document Revised: 03/15/2017 Document Reviewed: 03/15/2017 Elsevier Interactive Patient Education  2019 [employer]  

## 2019-04-24 NOTE — Progress Notes (Signed)
Patient ID: Troy Ellis, male   DOB: [birth year], 58 y.o.   MRN: [id number]  Chief Complaint  Patient presents with  . Follow-up    79month follow up    HPI Troy Ellis is a 58 y.o. male.  Patient returns in follow up of their venous disease.  They have done their best to comply with the prescribed conservative therapies of compression stockings, leg elevation, exercise, and still requires anti-inflammatories for discomfort and has symptoms that are persistent and bothersome on a daily basis, affecting their activities of daily living and normal activities.  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    Past Medical History:  Diagnosis Date  . Arthritis    both hands  . Cataract    x2 surgeries  . Depression   . Diabetes mellitus without complication ([place])    controlled with diet and excercise  . GERD (gastroesophageal reflux disease)   . Hyperlipidemia     Past Surgical History:  Procedure Laterality Date  . EYE SURGERY     Lasic   Family History  Problem Relation Age of Onset  . Diabetes Brother   . Diabetes Brother   . Colon cancer Neg Hx   no bleeding or clotting disorders No aneurysms  Social History Social History        Tobacco Use  . Smoking status: Never Smoker  . Smokeless tobacco: Never Used  Substance Use Topics  . Alcohol use: No    Alcohol/week: 0.0 standard drinks    Comment: occ  . Drug use: No    No Known Allergies        Current Outpatient Medications  Medication Sig Dispense Refill  . atorvastatin (LIPITOR) 80 MG tablet TAKE 1 TABLET (80 MG TOTAL) BY MOUTH DAILY. 90 tablet 0  . DULoxetine (CYMBALTA) 30 MG capsule TAKE 1 CAPSULE BY MOUTH 2 TIMES DAILY 180 capsule 1  . metFORMIN (GLUCOPHAGE-XR) 500 MG 24 hr tablet TAKE 2 TABLET BY MOUTH EVERY DAY IN THE EVENING 180 tablet 0            Current Facility-Administered Medications  Medication Dose Route Frequency Provider Last Rate Last  Dose  . 0.9 %  sodium chloride infusion  500 mL Intravenous Continuous Milus Banister, MD          REVIEW OF SYSTEMS (Negative unless checked)  Constitutional: [] ?Weight loss  [] ?Fever  [] ?Chills Cardiac: [] ?Chest pain   [] ?Chest pressure   [] ?Palpitations   [] ?Shortness of breath when laying flat   [] ?Shortness of breath at rest   [] ?Shortness of breath with exertion. Vascular:  [x] ?Pain in legs with walking   [x] ?Pain in legs at rest   [] ?Pain in legs when laying flat   [] ?Claudication   [] ?Pain in feet when walking  [] ?Pain in feet at rest  [] ?Pain in feet when laying flat   [] ?History of DVT   [] ?Phlebitis   [x] ?Swelling in legs   [] ?Varicose veins   [] ?Non-healing ulcers Pulmonary:   [] ?Uses home oxygen   [] ?Productive cough   [] ?Hemoptysis   [] ?Wheeze  [] ?COPD   [] ?Asthma Neurologic:  [] ?Dizziness  [] ?Blackouts   [] ?Seizures   [] ?History of stroke   [] ?History of TIA  [] ?Aphasia   [] ?Temporary blindness   [] ?Dysphagia   [] ?Weakness or numbness in arms   [x] ?Weakness or numbness in legs Musculoskeletal:  [x] ?Arthritis   [] ?Joint swelling   [] ?Joint pain   [] ?Low back pain Hematologic:  [] ?Easy bruising  [] ?  Easy bleeding   [] ?Hypercoagulable state   [] ?Anemic  [] ?Hepatitis Gastrointestinal:  [] ?Blood in stool   [] ?Vomiting blood  [] ?Gastroesophageal reflux/heartburn   [] ?Abdominal pain Genitourinary:  [] ?Chronic kidney disease   [] ?Difficult urination  [] ?Frequent urination  [] ?Burning with urination   [] ?Hematuria Skin:  [] ?Rashes   [] ?Ulcers   [] ?Wounds Psychological:  [] ?History of anxiety   [] ? History of major depression.      Physical Exam BP 136/71 (BP Location: Right Arm)   Pulse (!) 50   Resp 16   Wt 194 lb (88 kg)   BMI 31.55 kg/m  Gen:  WD/WN, NAD Head: [place]/AT, No temporalis wasting.  Ear/Nose/Throat: Hearing grossly intact, dentition good Eyes: Sclera non-icteric. Conjunctiva clear Neck: Supple. Trachea midline Pulmonary:  Good air movement, no use of  accessory muscles, respirations not labored.  Cardiac: RRR, No JVD Vascular: Varicosities diffuse and measuring up to 2-3 mm in the right lower extremity        Varicosities diffuse and measuring up to 2-3 mm in the left lower extremity Vessel Right Left  Radial Palpable Palpable                          PT Palpable Palpable  DP Palpable Palpable    Musculoskeletal: M/S 5/5 throughout. No LE edema this morning Neurologic: Sensation grossly intact in extremities.  Symmetrical.  Speech is fluent.  Psychiatric: Judgment intact, Mood & affect appropriate for pt's clinical situation. Dermatologic: No rashes or ulcers noted.  No cellulitis or open wounds.  Radiology No results found.  Labs Recent Results (from the past 2160 hour(s))  CBC with Differential/Platelet     Status: Abnormal   Collection Time: 02/06/19  3:24 PM  Result Value Ref Range   WBC 3.8 3.8 - 10.8 Thousand/uL   RBC 5.00 4.20 - 5.80 Million/uL   Hemoglobin 15.3 13.2 - 17.1 g/dL   HCT 44.2 38.5 - 50.0 %   MCV 88.4 80.0 - 100.0 fL   MCH 30.6 27.0 - 33.0 pg   MCHC 34.6 32.0 - 36.0 g/dL   RDW 13.1 11.0 - 15.0 %   Platelets 165 140 - 400 Thousand/uL   MPV 12.7 (H) 7.5 - 12.5 fL   Neutro Abs 2,166 1,500 - 7,800 cells/uL   Lymphs Abs 1,216 850 - 3,900 cells/uL   Absolute Monocytes 300 200 - 950 cells/uL   Eosinophils Absolute 99 15 - 500 cells/uL   Basophils Absolute 19 0 - 200 cells/uL   Neutrophils Relative % 57 %   Total Lymphocyte 32.0 %   Monocytes Relative 7.9 %   Eosinophils Relative 2.6 %   Basophils Relative 0.5 %  TSH     Status: None   Collection Time: 02/06/19  3:24 PM  Result Value Ref Range   TSH 1.27 0.40 - 4.50 mIU/L  Comprehensive metabolic panel     Status: Abnormal   Collection Time: 02/06/19  3:24 PM  Result Value Ref Range   Glucose, Bld 212 (H) 65 - 99 mg/dL    Comment: .            Fasting reference interval . For someone without known diabetes, a glucose value >125 mg/dL  indicates that they may have diabetes and this should be confirmed with a follow-up test. .    BUN 14 7 - 25 mg/dL   Creat 0.79 0.70 - 1.33 mg/dL    Comment: For patients >71 years of age, the reference limit  for Creatinine is approximately 13% higher for people identified as African-American. .    BUN/Creatinine Ratio NOT APPLICABLE 6 - 22 (calc)   Sodium 140 135 - 146 mmol/L   Potassium 4.3 3.5 - 5.3 mmol/L   Chloride 102 98 - 110 mmol/L   CO2 28 20 - 32 mmol/L   Calcium 9.9 8.6 - 10.3 mg/dL   Total Protein 7.3 6.1 - 8.1 g/dL   Albumin 4.7 3.6 - 5.1 g/dL   Globulin 2.6 1.9 - 3.7 g/dL (calc)   AG Ratio 1.8 1.0 - 2.5 (calc)   Total Bilirubin 0.5 0.2 - 1.2 mg/dL   Alkaline phosphatase (APISO) 124 35 - 144 U/L   AST 25 10 - 35 U/L   ALT 40 9 - 46 U/L  Hemoglobin A1c     Status: Abnormal   Collection Time: 02/06/19  3:24 PM  Result Value Ref Range   Hgb A1c MFr Bld 8.5 (H) <5.7 % of total Hgb    Comment: For someone without known diabetes, a hemoglobin A1c value of 6.5% or greater indicates that they may have  diabetes and this should be confirmed with a follow-up  test. . For someone with known diabetes, a value <7% indicates  that their diabetes is well controlled and a value  greater than or equal to 7% indicates suboptimal  control. A1c targets should be individualized based on  duration of diabetes, age, comorbid conditions, and  other considerations. . Currently, no consensus exists regarding use of hemoglobin A1c for diagnosis of diabetes for children. .    Mean Plasma Glucose 197 (calc)   eAG (mmol/L) 10.9 (calc)  Lipid panel     Status: None   Collection Time: 02/06/19  3:24 PM  Result Value Ref Range   Cholesterol 133 <200 mg/dL   HDL 54 > OR = 40 mg/dL   Triglycerides 120 <150 mg/dL   LDL Cholesterol (Calc) 58 mg/dL (calc)    Comment: Reference range: <100 . Desirable range <100 mg/dL for primary prevention;   <70 mg/dL for patients with CHD or  diabetic patients  with > or = 2 CHD risk factors. Marland Kitchen LDL-C is now calculated using the Martin-Hopkins  calculation, which is a validated novel method providing  better accuracy than the Friedewald equation in the  estimation of LDL-C.  Cresenciano Genre et al. Annamaria Helling. 1610;960(45): 2061-2068  (http://education.[facility].com/faq/FAQ164)    Total CHOL/HDL Ratio 2.5 <5.0 (calc)   Non-HDL Cholesterol (Calc) 79 <130 mg/dL (calc)    Comment: For patients with diabetes plus 1 major ASCVD risk  factor, treating to a non-HDL-C goal of <100 mg/dL  (LDL-C of <70 mg/dL) is considered a therapeutic  option.   VITAMIN D 25 Hydroxy (Vit-D Deficiency, Fractures)     Status: Abnormal   Collection Time: 02/06/19  3:24 PM  Result Value Ref Range   Vit D, 25-Hydroxy 22 (L) 30 - 100 ng/mL    Comment: Vitamin D Status         25-OH Vitamin D: . Deficiency:                    <20 ng/mL Insufficiency:             20 - 29 ng/mL Optimal:                 > or = 30 ng/mL . For 25-OH Vitamin D testing on patients on  D2-supplementation and patients for whom quantitation  of D2 and  D3 fractions is required, the QuestAssureD(TM) 25-OH VIT D, (D2,D3), LC/MS/MS is recommended: order  code 980-716-3104 (patients >39yrs). . For more information on this test, go to: http://education.[facility].com/faq/FAQ163 (This link is being provided for  informational/educational purposes only.)   PSA     Status: None   Collection Time: 02/06/19  3:24 PM  Result Value Ref Range   PSA 0.1 < OR = 4.0 ng/mL    Comment: The total PSA value from this assay system is  standardized against the WHO standard. The test  result will be approximately 20% lower when compared  to the equimolar-standardized total PSA (Beckman  Coulter). Comparison of serial PSA results should be  interpreted with this fact in mind. . This test was performed using the Siemens  chemiluminescent method. Values obtained from  different assay methods  cannot be used interchangeably. PSA levels, regardless of value, should not be interpreted as absolute evidence of the presence or absence of disease.     Assessment/Plan: Arthritis Likely a contributing factor to his lower extremity symptoms.  Does have a Baker's cyst on the right.  Diabetes ([place]) blood glucose control important in reducing the progression of atherosclerotic disease. Also, involved in wound healing. On appropriate medications.  Could have a component of lower extremity neuropathy present as well.  Pain in both lower extremities Likely multifactorial though venous insufficiency is likely playing a major contributing role or a primary role.  Hyperlipidemia lipid control important in reducing the progression of atherosclerotic disease. Continue statin therapy   Varicose veins of leg with pain, bilateral    The patient has done their best to comply with conservative therapy of 20-30 mm Hg compression stockings, leg elevation, exercise, and anti-inflammatories as needed for discomfort.  Despite this, they continue to have daily and persistent symptoms from their venous disease.  A venous reflux study demonstrates bilateral long segment GSV reflux.  As such, the patient is likely to benefit from endovenous laser ablation of the great saphenous veins bilaterally in a staged fashion.  Risks and benefits of the procedure including bleeding, infection, recanalization, DVT, and need for further therapy for residual varicosities were discussed.  The patient voices their understanding and is agreeable to proceed with bilateral GSV EVLT in a staged fashion.     Leotis Pain 04/24/2019, 10:52 AM

## 2019-04-24 NOTE — Assessment & Plan Note (Signed)
lipid control important in reducing the progression of atherosclerotic disease. Continue statin therapy  

## 2019-05-29 ENCOUNTER — Ambulatory Visit: Payer: Managed Care, Other (non HMO) | Admitting: Family

## 2019-06-12 ENCOUNTER — Ambulatory Visit: Payer: Managed Care, Other (non HMO) | Admitting: Family

## 2019-06-12 ENCOUNTER — Encounter: Payer: Self-pay | Admitting: Family

## 2019-06-12 ENCOUNTER — Ambulatory Visit (INDEPENDENT_AMBULATORY_CARE_PROVIDER_SITE_OTHER): Payer: Managed Care, Other (non HMO) | Admitting: Family

## 2019-06-12 DIAGNOSIS — M1990 Unspecified osteoarthritis, unspecified site: Secondary | ICD-10-CM | POA: Diagnosis not present

## 2019-06-12 DIAGNOSIS — K760 Fatty (change of) liver, not elsewhere classified: Secondary | ICD-10-CM

## 2019-06-12 DIAGNOSIS — I83813 Varicose veins of bilateral lower extremities with pain: Secondary | ICD-10-CM

## 2019-06-12 DIAGNOSIS — E119 Type 2 diabetes mellitus without complications: Secondary | ICD-10-CM | POA: Diagnosis not present

## 2019-06-12 LAB — HM DIABETES EYE EXAM

## 2019-06-12 MED ORDER — METFORMIN HCL 500 MG PO TABS: 500.0000 mg | ORAL_TABLET | Freq: Two times a day (BID) | ORAL | 3 refills | Status: AC

## 2019-06-12 MED ORDER — DULOXETINE HCL 60 MG PO CPEP: 60.0000 mg | ORAL_CAPSULE | Freq: Every day | ORAL | 3 refills | Status: DC

## 2019-06-12 NOTE — Assessment & Plan Note (Signed)
Stop Metformin ER due to recall, start plain metformin. Pending a1c, urine.

## 2019-06-12 NOTE — Assessment & Plan Note (Signed)
Advised follow up with GI and have asked Judson Roch ( CMA) to make patient a follow up.

## 2019-06-12 NOTE — Assessment & Plan Note (Addendum)
Chronic. Trial increase of cymbalta. + ANA. Asking patient if he would like to see rheumatology for further evaluation.

## 2019-06-12 NOTE — Progress Notes (Signed)
This visit type was conducted due to national recommendations for restrictions regarding the COVID-19 pandemic (e.g. social distancing).  This format is felt to be most appropriate for this patient at this time.  All issues noted in this document were discussed and addressed.  No physical exam was performed (except for noted visual exam findings with Video Visits). Virtual Visit via Video Note  I connected with@  on 06/12/19 at  5:30 PM EDT by a video enabled telemedicine application and verified that I am speaking with the correct person using two identifiers.  Location patient: home Location provider: home office Persons participating in the virtual visit: patient, provider  I discussed the limitations of evaluation and management by telemedicine and the availability of in person appointments. The patient expressed understanding and agreed to proceed.  Interactive audio and video telecommunications were attempted between this provider and patient, however failed, due to patient having technical difficulties or patient did not have access to video capability.  We continued and completed visit with audio only.     HPI:   DM- FBG 150- 200. increased metformin. No blurry vision, polyuria.   Chronic pain- neck, low back. No joint swelling or increased heat of joints.  Fatty liver disease-has seen GI. Liver enzymes normalized. + Ana  Leg pain/ GSV reflux - Continue to have pain; following with vascular; planning to BL ablation.   HLD- compliant lipitor ;having leg cramps. Drinking  Plenty of water.   ROS: See pertinent positives and negatives per HPI.  Past Medical History:  Diagnosis Date  . Arthritis    both hands  . Cataract    x2 surgeries  . Depression   . Diabetes mellitus without complication ([place])    controlled with diet and excercise  . GERD (gastroesophageal reflux disease)   . Hyperlipidemia     Past Surgical History:  Procedure Laterality Date  . EYE SURGERY      Lasic    Family History  Problem Relation Age of Onset  . Diabetes Brother   . Diabetes Brother   . Colon cancer Neg Hx   . Prostate cancer Neg Hx     SOCIAL HX: never smoker   Current Outpatient Medications:  .  atorvastatin (LIPITOR) 80 MG tablet, TAKE 1 TABLET (80 MG TOTAL) BY MOUTH DAILY., Disp: 90 tablet, Rfl: 1 .  DULoxetine (CYMBALTA) 60 MG capsule, Take 1 capsule (60 mg total) by mouth daily., Disp: 90 capsule, Rfl: 3 .  metFORMIN (GLUCOPHAGE) 500 MG tablet, Take 1 tablet (500 mg total) by mouth 2 (two) times daily with a meal., Disp: 180 tablet, Rfl: 3  Current Facility-Administered Medications:  .  0.9 %  sodium chloride infusion, 500 mL, Intravenous, Continuous, Milus Banister, MD    ASSESSMENT AND PLAN:  Discussed the following assessment and plan:  Problem List Items Addressed This Visit      Cardiovascular and Mediastinum   Varicose veins of leg with pain, bilateral    Continues. Considering surgery; will follow.         Digestive   Fatty liver    Advised follow up with GI and have asked Judson Roch ( CMA) to make patient a follow up.         Endocrine   Diabetes ([place]) - Primary    Stop Metformin ER due to recall, start plain metformin. Pending a1c, urine.      Relevant Medications   metFORMIN (GLUCOPHAGE) 500 MG tablet   Other Relevant Orders   Hemoglobin  A1c   Microalbumin / creatinine urine ratio     Musculoskeletal and Integument   Arthritis    Chronic. Trial increase of cymbalta. + ANA. Asking patient if he would like to see rheumatology for further evaluation.       Relevant Medications   DULoxetine (CYMBALTA) 60 MG capsule         I discussed the assessment and treatment plan with the patient. The patient was provided an opportunity to ask questions and all were answered. The patient agreed with the plan and demonstrated an understanding of the instructions.   The patient was advised to call back or seek an in-person evaluation if  the symptoms worsen or if the condition fails to improve as anticipated.   Mable Paris, FNP   I spent 25 min non face to face w/ pt.

## 2019-06-12 NOTE — Patient Instructions (Addendum)
Increase cymbalta to 60mg  once per day ( you had been on 20mg  twice per day). Let me know if this helps with joint pain.   STOP metformin XR. This has been recalled. Start PLAIN metformin.   Trial stop of taking the lipitor as may be causing cramps. Then may slowly increase. Increase water. Let me know if you think it is the lipitor causing cramps  Please also make a follow up with gastroenterology; this is important. You saw [facility] , [name], last year and we will call to make you a follow up.

## 2019-06-12 NOTE — Assessment & Plan Note (Signed)
Continues. Considering surgery; will follow.

## 2019-06-15 ENCOUNTER — Telehealth: Payer: Self-pay

## 2019-06-15 DIAGNOSIS — R768 Other specified abnormal immunological findings in serum: Secondary | ICD-10-CM

## 2019-06-15 NOTE — Progress Notes (Signed)
Message send to Joycelyn Schmid that patient would like referral.

## 2019-06-15 NOTE — Progress Notes (Signed)
Appointment made & labs scheduled. AVS printed and mailed.

## 2019-06-15 NOTE — Addendum Note (Signed)
Addended by: Burnard Hawthorne on: 06/15/2019 01:22 PM   Modules accepted: Orders

## 2019-06-15 NOTE — Telephone Encounter (Signed)
Thanks! Referral placed to rheum.

## 2019-06-15 NOTE — Telephone Encounter (Signed)
I spoke with patient's wife to let her know that GI appointment was made with Dr. [name] for 7/31 at 2pm. Also, labs have been scheduled for 7/24. Patient would also like referral to rheumatology for positive ANA lab.

## 2019-06-26 ENCOUNTER — Ambulatory Visit
Admission: RE | Admit: 2019-06-26 | Discharge: 2019-06-26 | Disposition: A | Discharge status: Home / Self Care | Payer: Managed Care, Other (non HMO) | Source: Ambulatory Visit | Attending: Internal Medicine | Admitting: Internal Medicine

## 2019-06-26 ENCOUNTER — Ambulatory Visit: Payer: Managed Care, Other (non HMO) | Admitting: Internal Medicine

## 2019-06-26 ENCOUNTER — Other Ambulatory Visit: Payer: Self-pay

## 2019-06-26 ENCOUNTER — Telehealth: Payer: Self-pay | Admitting: Internal Medicine

## 2019-06-26 ENCOUNTER — Encounter: Payer: Self-pay | Admitting: Internal Medicine

## 2019-06-26 VITALS — BP 130/74 | HR 60 | Temp 97.5°F | Ht 64.0 in | Wt 190.4 lb

## 2019-06-26 DIAGNOSIS — R0602 Shortness of breath: Secondary | ICD-10-CM

## 2019-06-26 NOTE — Addendum Note (Signed)
Addended by: Maryanna Shape A on: 06/26/2019 10:02 AM   Modules accepted: Orders

## 2019-06-26 NOTE — Progress Notes (Signed)
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 Just recently he started to wear a mask  Patient denies any TB exposure Patient denies any HIV exposure  At this time patient does have alcohol abuse and does have liver problems He also has a diagnosis of diabetes  At this time no signs of infection No signs of respiratory distress at this time  I have explained to him that he will need further testing for assessment of his breathing  Patient agrees to testing   PAST MEDICAL HISTORY :   has a past medical history of Arthritis, Cataract, Depression, Diabetes mellitus without complication ([place]), GERD (gastroesophageal reflux disease), and Hyperlipidemia.  has a past surgical history that includes Eye surgery. Prior to Admission medications   Medication Sig Start Date End Date Taking? Authorizing Provider  atorvastatin (LIPITOR) 80 MG tablet TAKE 1 TABLET (80 MG TOTAL) BY MOUTH DAILY. 03/13/19   Burnard Hawthorne, FNP  DULoxetine (CYMBALTA) 60 MG capsule Take 1 capsule (60 mg total) by mouth daily. 06/12/19   Burnard Hawthorne, FNP  metFORMIN (GLUCOPHAGE) 500 MG tablet Take 1 tablet (500 mg total) by mouth 2 (two) times daily with a meal. 06/12/19   Burnard Hawthorne, FNP   No Known Allergies  FAMILY HISTORY:  family history includes Diabetes in his brother and brother. SOCIAL HISTORY:  reports  that he has never smoked. He has never used smokeless tobacco. He reports that he does not drink alcohol or use drugs.    Review of Systems:  Gen:  Denies  fever, sweats, chills weigh loss  HEENT: Denies blurred vision, double vision, ear pain, eye pain, hearing loss, nose bleeds, sore throat Cardiac:  No dizziness, chest pain or heaviness, chest tightness,edema, No JVD Resp:   + cough, -sputum production, +shortness of breath,-wheezing, -hemoptysis,  Gi: Denies swallowing difficulty, stomach pain, nausea or vomiting, diarrhea, constipation, bowel incontinence Gu:  Denies bladder incontinence, burning urine Ext:   Denies Joint pain, stiffness or swelling Skin: Denies  skin rash, easy bruising or bleeding or hives Endoc:  Denies polyuria, polydipsia , polyphagia or weight change Psych:   Denies depression, insomnia or hallucinations  Other:  All other systems negative   BP 130/74 (BP Location: Left Arm, Cuff Size: Normal)   Pulse 60   Temp (!) 97.5 F (36.4 C) (Skin)   Ht 5\' 4"  (1.626 m)   Wt 190 lb 6.4 oz (86.4 kg)   SpO2 97%   BMI 32.68 kg/m   Physical Examination:   GENERAL:NAD, no fevers, chills, no weakness no fatigue HEAD: Normocephalic, atraumatic.  EYES: PERLA, EOMI No scleral icterus.  MOUTH: Moist mucosal membrane.  EAR, NOSE, THROAT: Clear without exudates. No external lesions.  NECK: Supple. No thyromegaly.  No JVD.  PULMONARY: CTA B/L no wheezing, rhonchi, crackles CARDIOVASCULAR: S1 and S2. Regular rate and rhythm. No murmurs GASTROINTESTINAL: Soft, nontender, nondistended. Positive bowel sounds.  MUSCULOSKELETAL: No swelling,  clubbing, or edema.  NEUROLOGIC: No gross focal neurological deficits. 5/5 strength all extremities SKIN: No ulceration, lesions, rashes, or cyanosis.  PSYCHIATRIC: Insight, judgment intact. -depression -anxiety ALL OTHER ROS ARE NEGATIVE   MEDICATIONS: I have reviewed all medications and confirmed regimen as documented        ASSESSMENT AND PLAN SYNOPSIS 58 year old pleasant Hispanic male seen today for progressive shortness of breath over the last several months Etiology is unclear at this time however underlying COPD and intermittent reactive airways disease is a possibility  At this time I would recommend obtaining a chest x-ray two-view I also recommend obtaining pulmonary function testing Will obtain 6-minute walk test Will obtain overnight pulse oximetry   COVID-19 EDUCATION: The signs and symptoms of COVID-19 were discussed with the patient and how to seek care for testing.  The importance of social distancing was discussed today. Hand Washing Techniques and avoid touching face was advised.  MEDICATION ADJUSTMENTS/LABS AND TESTS ORDERED: 6-minute walk test Pulmonary function testing Overnight pulse oximetry Chest x-ray    CURRENT MEDICATIONS REVIEWED AT LENGTH WITH PATIENT TODAY   Patient satisfied with Plan of action and management. All questions answered  Follow up in 3 months follow-up after test completed   Corrin Parker, M.D.  [facility]  Medical Director [place] Director [facility] Cardio-Pulmonary Department

## 2019-06-26 NOTE — Telephone Encounter (Signed)
CXR has been corrected.  Troy Ellis is aware.  Nothing further is needed.

## 2019-06-26 NOTE — Telephone Encounter (Signed)
PATIENT IS WAITING

## 2019-06-26 NOTE — Patient Instructions (Signed)
Obtain CXR Obtain PFT's Obtain 6 MWT  Obtain Overnight Pulse Oximetry

## 2019-07-10 ENCOUNTER — Telehealth: Payer: Self-pay

## 2019-07-10 ENCOUNTER — Ambulatory Visit: Payer: Managed Care, Other (non HMO)

## 2019-07-10 ENCOUNTER — Other Ambulatory Visit: Payer: Managed Care, Other (non HMO)

## 2019-07-10 NOTE — Telephone Encounter (Signed)
Copied from [place] [id number]. Topic: Appointment Scheduling - Scheduling Inquiry for Clinic >> Jul 09, 2019  5:10 PM Alanda Slim E wrote: Reason for CRM: Pt wants to cancel his lab appt for tomorrow. He has to work and will not be able to make it/ please cancel

## 2019-07-10 NOTE — Telephone Encounter (Signed)
Lab appt cancelled 

## 2019-07-17 ENCOUNTER — Encounter: Payer: Self-pay | Admitting: Gastroenterology

## 2019-07-17 ENCOUNTER — Ambulatory Visit (INDEPENDENT_AMBULATORY_CARE_PROVIDER_SITE_OTHER): Payer: Managed Care, Other (non HMO) | Admitting: Gastroenterology

## 2019-07-17 VITALS — Ht 64.0 in | Wt 190.0 lb

## 2019-07-17 DIAGNOSIS — R945 Abnormal results of liver function studies: Secondary | ICD-10-CM

## 2019-07-17 DIAGNOSIS — R7989 Other specified abnormal findings of blood chemistry: Secondary | ICD-10-CM

## 2019-07-17 NOTE — Progress Notes (Signed)
Review of pertinent gastrointestinal problems: 1.  Elevated liver tests, probably fatty liver disease, BMI 33 (mild elevation of AST and ALT intermittently, also mild elevation of alkaline phosphatase).  Evaluated August 2019.  2019 Blood tests show normal ceruloplasmin, normal alpha-1 antitrypsin, AMA negative, ASMA negative, ANA was positive with a titer of 1:640.  Hepatitis B surface antibody negative, hepatitis a total antibody positive, hepatitis C antibody negative, hepatitis B surface antigen negative.  November 2019 US impression reads "query mild hepatic steatosis" 2.  History of adenomatous colon polyps.  Colonoscopy for routine risk screening February 2018, 5 subcentimeter adenomas removed.  He was recommended to have repeat colonoscopy at 3-year interval.   This service was provided via virtual visit.  I tried connecting with him via audiovisual app however he never answer that.  I called him on 1 of his numbers by phone but he did not answer that I left a voicemail.  I called him on another phone number and his wife answered and so we were able to finally communicate.  His wife was with him.  I believe they were at home.  I was located in my office.  The patient did consent to this virtual visit and is aware of possible charges through their insurance for this visit.  The patient is an established patient.  My certified medical assistant, Grace Bushy, contributed to this visit by contacting the patient by phone 1 or 2 business days prior to the appointment and also followed up on the recommendations I made after the visit.  Time spent on virtual visit: 20 minutes   HPI: This is a very pleasant 58 year old man  He was last here in our office about 8 months ago and he saw [place].  He was asked to continue abstaining from alcohol.  He was recommended to work on weight loss.  He was asked to repeat liver tests in 3 months which was done however ferritin and TIBC were never sent.  It was  understood that he was going to be seeing rheumatology for his very elevated ANA.  He and his wife told me today that they never heard about a rheumatology appointment and were still waiting for that appointment to be scheduled.  That was 8 or 9 months ago.   Blood tests February 2020 show completely normal liver tests  He has had no significant abdominal pains, no jaundice no itching.  Chief complaint is elevated liver tests  ROS: complete GI ROS as described in HPI, all other review negative.  Constitutional:  No unintentional weight loss   Past Medical History:  Diagnosis Date  . Arthritis    both hands  . Cataract    x2 surgeries  . Depression   . Diabetes mellitus without complication ([place])    controlled with diet and excercise  . GERD (gastroesophageal reflux disease)   . Hyperlipidemia     Past Surgical History:  Procedure Laterality Date  . EYE SURGERY     Lasic    Current Outpatient Medications  Medication Sig Dispense Refill  . atorvastatin (LIPITOR) 80 MG tablet TAKE 1 TABLET (80 MG TOTAL) BY MOUTH DAILY. 90 tablet 1  . DULoxetine (CYMBALTA) 60 MG capsule Take 1 capsule (60 mg total) by mouth daily. 90 capsule 3  . metFORMIN (GLUCOPHAGE) 500 MG tablet Take 1 tablet (500 mg total) by mouth 2 (two) times daily with a meal. 180 tablet 3   Current Facility-Administered Medications  Medication Dose Route Frequency Provider Last Rate  Last Dose  . 0.9 %  sodium chloride infusion  500 mL Intravenous Continuous Milus Banister, MD        Allergies as of 07/17/2019  . (No Known Allergies)    Family History  Problem Relation Age of Onset  . Diabetes Brother   . Diabetes Brother   . Colon cancer Neg Hx   . Prostate cancer Neg Hx     Social History   Socioeconomic History  . Marital status: Married    Spouse name: Not on file  . Number of children: Not on file  . Years of education: Not on file  . Highest education level: Not on file  Occupational  History  . Not on file  Social Needs  . Financial resource strain: Not on file  . Food insecurity    Worry: Not on file    Inability: Not on file  . Transportation needs    Medical: Not on file    Non-medical: Not on file  Tobacco Use  . Smoking status: Never Smoker  . Smokeless tobacco: Never Used  Substance and Sexual Activity  . Alcohol use: No    Alcohol/week: 0.0 standard drinks    Comment: occ  . Drug use: No  . Sexual activity: Not on file  Lifestyle  . Physical activity    Days per week: Not on file    Minutes per session: Not on file  . Stress: Not on file  Relationships  . Social Herbalist on phone: Not on file    Gets together: Not on file    Attends religious service: Not on file    Active member of club or organization: Not on file    Attends meetings of clubs or organizations: Not on file    Relationship status: Not on file  . Intimate partner violence    Fear of current or ex partner: Not on file    Emotionally abused: Not on file    Physically abused: Not on file    Forced sexual activity: Not on file  Other Topics Concern  . Not on file  Social History Narrative   Works as Nature conservation officer.       Married.       Grandfather .     Physical Exam: Unable to perform because this was a "telemed visit" due to current Covid-19 pandemic  Assessment and plan: 58 y.o. male with elevated liver tests, elevated ANA  His most recent set of liver tests (3-4 months ago) were all completely normal.  We will repeat his liver tests today as well as rounding out the work-up with a ferritin and TIBC level.  He has no specific liver symptoms.  His ANA level was very high previously.  I believe we understood that he was going to be seeing a rheumatologist about that however that appointment never happened.  The patient and his wife tell me he never heard about an appointment.  That was 7 or 8 months ago.  We will work on rheumatology referral now for  him.  Please see the "Patient Instructions" section for addition details about the plan.  Owens Loffler, MD [facility] 07/17/2019, 1:37 PM

## 2019-07-17 NOTE — Patient Instructions (Addendum)
We will arrange for repeat lab testing including CBC, complete metabolic profile, ferritin, TIBC and ANA level.   We will also arrange rheumatology consultation for previously very elevated ANA level.  Thank you for entrusting me with your care and choosing [facility].  Dr [name]

## 2019-07-20 ENCOUNTER — Telehealth: Payer: Self-pay | Admitting: Family

## 2019-07-20 NOTE — Telephone Encounter (Signed)
Call pt  I was reviewing his chart and noted that cymbalta may affect liver function. I thought important as he has had elevated liver enzymes in the past.   I wanted to speak with him about the cymbalta. I spoke with our pharmacist and she felt we could continue medication if he felt VERY helpful and he is not having active liver issues ( NOT having jaundice, elevated LFT) . And continue to monitor closely such as with 2month follow ups OR we could consider decreasing back to 30 mg or stopping cymbalta.    OR we could switch medication all together. If doesn't feel he needs the medication, we can even STOP all together  Happy to sch doxy if he would prefer to discuss.

## 2019-07-20 NOTE — Telephone Encounter (Signed)
Patient daughter Troy Ellis called to say that he is out of town working and it is difficult to get him during the week she states that if there are any issues she can be reached at Ph#  [phone]

## 2019-07-20 NOTE — Progress Notes (Signed)
Subjective:    Patient ID: Troy Ellis, male    DOB: Apr 30, 1961, 58 y.o.   MRN: [id number]  CC: Troy Ellis is a 58 y.o. male who presents today for follow up.   HPI: Feels well today. No new complaints.  Accompanied by daughter.   Leg pain has improved since established with Dr Troy Ellis. Not sure that he needs cymbalta anymore. No numbness in legs. Occasional low back pain however not bothersome at this time.   Would like to discuss cymbalta, hepatotoxicity.  Pending pulmonary function tests with Dr Troy Ellis. SOB is unchanged since seeing him.   Following with Dr Troy Ellis for elevated LFTs, since resolved.  Has an appointment scheduled with rheumatology for + ANA. No unusual joint pain.     HISTORY:  Past Medical History:  Diagnosis Date  . Arthritis    both hands  . Cataract    x2 surgeries  . Depression   . Diabetes mellitus without complication (Troy Ellis)    controlled with diet and excercise  . GERD (gastroesophageal reflux disease)   . Hyperlipidemia    Past Surgical History:  Procedure Laterality Date  . EYE SURGERY     Lasic   Family History  Problem Relation Age of Onset  . Diabetes Brother   . Diabetes Brother   . Colon cancer Neg Hx   . Prostate cancer Neg Hx     Allergies: Patient has no known allergies. Current Outpatient Medications on File Prior to Visit  Medication Sig Dispense Refill  . atorvastatin (LIPITOR) 80 MG tablet TAKE 1 TABLET (80 MG TOTAL) BY MOUTH DAILY. 90 tablet 1  . DULoxetine (CYMBALTA) 60 MG capsule Take 1 capsule (60 mg total) by mouth daily. 90 capsule 3  . metFORMIN (GLUCOPHAGE) 500 MG tablet Take 1 tablet (500 mg total) by mouth 2 (two) times daily with a meal. 180 tablet 3   Current Facility-Administered Medications on File Prior to Visit  Medication Dose Route Frequency Provider Last Rate Last Dose  . 0.9 %  sodium chloride infusion  500 mL Intravenous Continuous Troy Banister, MD        Social History   Tobacco Use   . Smoking status: Never Smoker  . Smokeless tobacco: Never Used  Substance Use Topics  . Alcohol use: No    Alcohol/week: 0.0 standard drinks    Comment: occ  . Drug use: No    Review of Systems  Constitutional: Negative for chills and fever.  Respiratory: Negative for cough.   Cardiovascular: Negative for chest pain and palpitations.  Gastrointestinal: Negative for nausea and vomiting.  Musculoskeletal: Positive for arthralgias (leg pain).  Psychiatric/Behavioral: Negative for sleep disturbance and suicidal ideas.      Objective:    BP (!) 98/56   Pulse 74   Temp 98 F (36.7 C)   Wt 189 lb 9.6 oz (86 kg)   SpO2 95%   BMI 31.55 kg/m  BP Readings from Last 3 Encounters:  07/24/19 (!) 98/56  07/24/19 140/72  06/26/19 130/74   Wt Readings from Last 3 Encounters:  07/24/19 189 lb 9.6 oz (86 kg)  07/24/19 186 lb 6.4 oz (84.6 kg)  07/17/19 190 lb (86.2 kg)    Physical Exam Vitals signs reviewed.  Constitutional:      Appearance: He is well-developed.  Cardiovascular:     Rate and Rhythm: Regular rhythm.     Heart sounds: Normal heart sounds.  Pulmonary:     Effort: Pulmonary effort  is normal. No respiratory distress.     Breath sounds: Normal breath sounds. No wheezing, rhonchi or rales.  Musculoskeletal:     Right lower leg: No edema.     Left lower leg: No edema.  Skin:    General: Skin is warm and dry.  Neurological:     Mental Status: He is alert.  Psychiatric:        Speech: Speech normal.        Behavior: Behavior normal.        Assessment & Plan:   Problem List Items Addressed This Visit      Other   Pain in both lower extremities    Due to potential hepatotoxicity and also that patient's pain is  More vascular in etiology, we will trial STOP cymbalta. If patient feels he does need medication for pain, we can trial gabapentin.  He will let me know          I am having Troy Ellis maintain his atorvastatin, DULoxetine, and metFORMIN.  We will continue to administer sodium chloride.   No orders of the defined types were placed in this encounter.   Return precautions given.   Risks, benefits, and alternatives of the medications and treatment plan prescribed today were discussed, and patient expressed understanding.   Education regarding symptom management and diagnosis given to patient on AVS.  Continue to follow with Troy Hawthorne, FNP for routine health maintenance.   Troy Ellis and I agreed with plan.   Troy Paris, FNP

## 2019-07-24 ENCOUNTER — Encounter: Payer: Self-pay | Admitting: Family

## 2019-07-24 ENCOUNTER — Encounter (INDEPENDENT_AMBULATORY_CARE_PROVIDER_SITE_OTHER): Payer: Self-pay | Admitting: Vascular Surgery

## 2019-07-24 ENCOUNTER — Ambulatory Visit (INDEPENDENT_AMBULATORY_CARE_PROVIDER_SITE_OTHER): Payer: Managed Care, Other (non HMO) | Admitting: Vascular Surgery

## 2019-07-24 ENCOUNTER — Ambulatory Visit: Payer: Managed Care, Other (non HMO) | Admitting: Family

## 2019-07-24 ENCOUNTER — Other Ambulatory Visit: Payer: Self-pay

## 2019-07-24 VITALS — BP 140/72 | HR 60 | Resp 16 | Ht 65.0 in | Wt 186.4 lb

## 2019-07-24 DIAGNOSIS — M79605 Pain in left leg: Secondary | ICD-10-CM | POA: Diagnosis not present

## 2019-07-24 DIAGNOSIS — I83811 Varicose veins of right lower extremities with pain: Secondary | ICD-10-CM

## 2019-07-24 DIAGNOSIS — M79604 Pain in right leg: Secondary | ICD-10-CM | POA: Diagnosis not present

## 2019-07-24 DIAGNOSIS — I83813 Varicose veins of bilateral lower extremities with pain: Secondary | ICD-10-CM

## 2019-07-24 NOTE — Patient Instructions (Signed)
Trial stop of cymbalta.  Start cymbalta 30mg  today for one week Then take 30mg  every other day for one week  May stop if you feel well off of medication  Please ensure you keep appointments with rheumatology, pulmonology and gastroenterology.  Stay safe!

## 2019-07-24 NOTE — Assessment & Plan Note (Signed)
Due to potential hepatotoxicity and also that patient's pain is  More vascular in etiology, we will trial STOP cymbalta. If patient feels he does need medication for pain, we can trial gabapentin.  He will let me know

## 2019-07-24 NOTE — Progress Notes (Signed)
  RITO LECOMTE is a 58 y.o. male who presents with symptomatic venous reflux  Past Medical History:  Diagnosis Date  . Arthritis    both hands  . Cataract    x2 surgeries  . Depression   . Diabetes mellitus without complication ([place])    controlled with diet and excercise  . GERD (gastroesophageal reflux disease)   . Hyperlipidemia     Past Surgical History:  Procedure Laterality Date  . EYE SURGERY     Lasic     Current Outpatient Medications:  .  atorvastatin (LIPITOR) 80 MG tablet, TAKE 1 TABLET (80 MG TOTAL) BY MOUTH DAILY., Disp: 90 tablet, Rfl: 1 .  DULoxetine (CYMBALTA) 60 MG capsule, Take 1 capsule (60 mg total) by mouth daily., Disp: 90 capsule, Rfl: 3 .  metFORMIN (GLUCOPHAGE) 500 MG tablet, Take 1 tablet (500 mg total) by mouth 2 (two) times daily with a meal., Disp: 180 tablet, Rfl: 3  Current Facility-Administered Medications:  .  0.9 %  sodium chloride infusion, 500 mL, Intravenous, Continuous, Milus Banister, MD  No Known Allergies   Varicose veins of leg with pain, bilateral     PLAN: The patient's right lower extremity was sterilely prepped and draped. The ultrasound machine was used to visualize the saphenous vein throughout its course. A segment just below the knee was selected for access. The saphenous vein was accessed without difficulty using ultrasound guidance with a micropuncture needle. A 0.018 wire was then placed beyond the saphenofemoral junction and the needle was removed. The 65 cm sheath was then placed over the wire and the wire and dilator were removed. The laser fiber was then placed through the sheath and its tip was placed approximately 4 centimeters below the saphenofemoral junction. Tumescent anesthesia was then created with a dilute lidocaine solution. Laser energy was then delivered with constant withdrawal of the sheath and laser fiber. Approximately 1243 joules of energy were delivered over a length of 35 centimeters using a 1470 Hz  VenaCure machine at 7 W. Sterile dressings were placed. The patient tolerated the procedure well without obvious complications.   Follow-up in 1 week with post-laser duplex.

## 2019-07-27 ENCOUNTER — Telehealth: Payer: Self-pay | Admitting: Family

## 2019-07-27 ENCOUNTER — Ambulatory Visit (INDEPENDENT_AMBULATORY_CARE_PROVIDER_SITE_OTHER): Payer: Managed Care, Other (non HMO)

## 2019-07-27 ENCOUNTER — Other Ambulatory Visit: Payer: Self-pay

## 2019-07-27 DIAGNOSIS — I83813 Varicose veins of bilateral lower extremities with pain: Secondary | ICD-10-CM

## 2019-07-27 MED ORDER — DULOXETINE HCL 30 MG PO CPEP: 30.0000 mg | ORAL_CAPSULE | Freq: Every day | ORAL | 0 refills | Status: DC

## 2019-07-27 NOTE — Telephone Encounter (Signed)
Call pt  I wanted to ensure he knew that I sent in 30mg  cymbalta to his pharmacy; I meant to send Friday however I sent this morning. He cannot stop the 60mg  cymbalta suddenly.  He needs to following weaning as I wrote on his avs:  Trial stop of cymbalta.   Start cymbalta 30mg  today for one week  Then take 30mg  every other day for one week  May stop at that time if you feel well off of medication

## 2019-07-27 NOTE — Telephone Encounter (Signed)
I spoke with patient's daughter, Troy Ellis & advised on below. She will explain to patient on how to wean off medication as discussed.

## 2019-07-29 ENCOUNTER — Telehealth (INDEPENDENT_AMBULATORY_CARE_PROVIDER_SITE_OTHER): Payer: Self-pay

## 2019-07-30 NOTE — Telephone Encounter (Signed)
I spoke with Eulogio Ditch NP and she informed this is to be expected after a laser procedure and the patient is having some inflammation so she advise for the patient to take ibuprofen and do cold compresses.Patient daughter has been aware with medical advice and verbalized understanding.

## 2019-08-06 ENCOUNTER — Other Ambulatory Visit (INDEPENDENT_AMBULATORY_CARE_PROVIDER_SITE_OTHER): Payer: Self-pay | Admitting: Vascular Surgery

## 2019-08-07 ENCOUNTER — Other Ambulatory Visit: Payer: Self-pay

## 2019-08-07 ENCOUNTER — Encounter (INDEPENDENT_AMBULATORY_CARE_PROVIDER_SITE_OTHER): Payer: Self-pay | Admitting: Vascular Surgery

## 2019-08-07 ENCOUNTER — Ambulatory Visit (INDEPENDENT_AMBULATORY_CARE_PROVIDER_SITE_OTHER): Payer: Managed Care, Other (non HMO) | Admitting: Vascular Surgery

## 2019-08-07 VITALS — BP 143/81 | HR 61 | Resp 16 | Ht 65.0 in | Wt 186.6 lb

## 2019-08-07 DIAGNOSIS — I83813 Varicose veins of bilateral lower extremities with pain: Secondary | ICD-10-CM

## 2019-08-07 DIAGNOSIS — I83812 Varicose veins of left lower extremities with pain: Secondary | ICD-10-CM | POA: Diagnosis not present

## 2019-08-07 NOTE — Progress Notes (Signed)
  Troy Ellis is a 58 y.o. male who presents with symptomatic venous reflux  Past Medical History:  Diagnosis Date  . Arthritis    both hands  . Cataract    x2 surgeries  . Depression   . Diabetes mellitus without complication ([name])    controlled with diet and excercise  . GERD (gastroesophageal reflux disease)   . Hyperlipidemia     Past Surgical History:  Procedure Laterality Date  . EYE SURGERY     Lasic     Current Outpatient Medications:  .  atorvastatin (LIPITOR) 80 MG tablet, TAKE 1 TABLET (80 MG TOTAL) BY MOUTH DAILY., Disp: 90 tablet, Rfl: 1 .  DULoxetine (CYMBALTA) 30 MG capsule, Take 1 capsule (30 mg total) by mouth daily., Disp: 30 capsule, Rfl: 0 .  metFORMIN (GLUCOPHAGE) 500 MG tablet, Take 1 tablet (500 mg total) by mouth 2 (two) times daily with a meal., Disp: 180 tablet, Rfl: 3  Current Facility-Administered Medications:  .  0.9 %  sodium chloride infusion, 500 mL, Intravenous, Continuous, Milus Banister, MD  No Known Allergies   Varicose veins of leg with pain, bilateral     PLAN: The patient's left lower extremity was sterilely prepped and draped. The ultrasound machine was used to visualize the saphenous vein throughout its course. A segment in the just below the knee was selected for access. The saphenous vein was accessed without difficulty using ultrasound guidance with a micropuncture needle. A 0.018 wire was then placed beyond the saphenofemoral junction and the needle was removed. The 65 cm sheath was then placed over the wire and the wire and dilator were removed. The laser fiber was then placed through the sheath and its tip was placed approximately 5 centimeters below the saphenofemoral junction. Tumescent anesthesia was then created with a dilute lidocaine solution. Laser energy was then delivered with constant withdrawal of the sheath and laser fiber. Approximately 1185 joules of energy were delivered over a length of 32 centimeters using a  1470 Hz VenaCure machine at 7 W. Sterile dressings were placed. The patient tolerated the procedure well without obvious complications.   Follow-up in 1 week with post-laser duplex.

## 2019-08-10 ENCOUNTER — Other Ambulatory Visit: Payer: Self-pay

## 2019-08-10 ENCOUNTER — Ambulatory Visit (INDEPENDENT_AMBULATORY_CARE_PROVIDER_SITE_OTHER): Payer: Managed Care, Other (non HMO)

## 2019-08-10 DIAGNOSIS — I83813 Varicose veins of bilateral lower extremities with pain: Secondary | ICD-10-CM

## 2019-08-10 DIAGNOSIS — I83812 Varicose veins of left lower extremities with pain: Secondary | ICD-10-CM | POA: Diagnosis not present

## 2019-08-21 ENCOUNTER — Ambulatory Visit (INDEPENDENT_AMBULATORY_CARE_PROVIDER_SITE_OTHER): Payer: Managed Care, Other (non HMO) | Admitting: Nurse Practitioner

## 2019-08-23 ENCOUNTER — Other Ambulatory Visit: Payer: Self-pay | Admitting: Family

## 2019-08-23 DIAGNOSIS — E785 Hyperlipidemia, unspecified: Secondary | ICD-10-CM

## 2019-08-26 ENCOUNTER — Telehealth: Payer: Self-pay | Admitting: Internal Medicine

## 2019-08-26 NOTE — Telephone Encounter (Signed)
PFT R/S to Thurs 10/29/2019 at 8:15. Pt will need to arrive at 8:00 to the Medical [facility].  Instructions and appointment mailed to patient.   LMOVM for pt's wife of new appointment date and time. Advised wife that if this is not a good date or time,she can contact cardiopulmonary at [phone] to R/S. Nothing else needed at this time. [name]

## 2019-08-26 NOTE — Telephone Encounter (Signed)
Rhonda, please advise. Thanks. 

## 2019-08-28 ENCOUNTER — Ambulatory Visit: Payer: Managed Care, Other (non HMO) | Admitting: Rheumatology

## 2019-09-04 ENCOUNTER — Ambulatory Visit (INDEPENDENT_AMBULATORY_CARE_PROVIDER_SITE_OTHER): Payer: Managed Care, Other (non HMO) | Admitting: Nurse Practitioner

## 2019-09-04 ENCOUNTER — Other Ambulatory Visit: Payer: Self-pay

## 2019-09-04 ENCOUNTER — Encounter (INDEPENDENT_AMBULATORY_CARE_PROVIDER_SITE_OTHER): Payer: Self-pay | Admitting: Nurse Practitioner

## 2019-09-04 VITALS — BP 139/81 | Resp 12 | Ht 64.0 in | Wt 188.0 lb

## 2019-09-04 DIAGNOSIS — M1990 Unspecified osteoarthritis, unspecified site: Secondary | ICD-10-CM

## 2019-09-04 DIAGNOSIS — E785 Hyperlipidemia, unspecified: Secondary | ICD-10-CM | POA: Diagnosis not present

## 2019-09-04 DIAGNOSIS — I83813 Varicose veins of bilateral lower extremities with pain: Secondary | ICD-10-CM

## 2019-09-04 NOTE — Progress Notes (Signed)
SUBJECTIVE:  Patient ID: Troy Ellis, male    DOB: 1961-09-26, 58 y.o.   MRN: [id number] Chief Complaint  Patient presents with  . Follow-up    HPI  Troy Ellis is a 58 y.o. male The patient returns to the office for followup status post laser ablation of the left and right great saphenous veins on 08/07/2019 and 07/24/2019 respectively . The patient notes multiple residual varicosities bilaterally which continued to hurt with dependent positions and remained tender to palpation. The patient's swelling is unchanged from preoperative status. The patient continues to wear graduated compression stockings on a daily basis but these are not eliminating the pain and discomfort. The patient continues to use over-the-counter anti-inflammatory medications to treat the pain and related symptoms but this has not given the patient relief. The patient notes the pain in the lower extremities is causing problems with daily exercise, problems at work and even with household activities such as preparing meals and doing dishes.  The patient is otherwise done well and there have been no complications related to the laser procedure or interval changes in the patient's overall   Venous ultrasound post laser shows successful laser ablation of the bilateral GSVs, no DVT identified.  Past Medical History:  Diagnosis Date  . Arthritis    both hands  . Cataract    x2 surgeries  . Depression   . Diabetes mellitus without complication ([place])    controlled with diet and excercise  . GERD (gastroesophageal reflux disease)   . Hyperlipidemia     Past Surgical History:  Procedure Laterality Date  . EYE SURGERY     Lasic    Social History   Socioeconomic History  . Marital status: Married    Spouse name: Not on file  . Number of children: Not on file  . Years of education: Not on file  . Highest education level: Not on file  Occupational History  . Not on file  Social Needs  . Financial  resource strain: Not on file  . Food insecurity    Worry: Not on file    Inability: Not on file  . Transportation needs    Medical: Not on file    Non-medical: Not on file  Tobacco Use  . Smoking status: Never Smoker  . Smokeless tobacco: Never Used  Substance and Sexual Activity  . Alcohol use: No    Alcohol/week: 0.0 standard drinks    Comment: occ  . Drug use: No  . Sexual activity: Not on file  Lifestyle  . Physical activity    Days per week: Not on file    Minutes per session: Not on file  . Stress: Not on file  Relationships  . Social Herbalist on phone: Not on file    Gets together: Not on file    Attends religious service: Not on file    Active member of club or organization: Not on file    Attends meetings of clubs or organizations: Not on file    Relationship status: Not on file  . Intimate partner violence    Fear of current or ex partner: Not on file    Emotionally abused: Not on file    Physically abused: Not on file    Forced sexual activity: Not on file  Other Topics Concern  . Not on file  Social History Narrative   Works as Nature conservation officer.       Married.  Grandfather .    Family History  Problem Relation Age of Onset  . Diabetes Brother   . Diabetes Brother   . Colon cancer Neg Hx   . Prostate cancer Neg Hx     No Known Allergies   Review of Systems   Review of Systems: Negative Unless Checked Constitutional: [] Weight loss  [] Fever  [] Chills Cardiac: [] Chest pain   []  Atrial Fibrillation  [] Palpitations   [] Shortness of breath when laying flat   [] Shortness of breath with exertion. [] Shortness of breath at rest Vascular:  [] Pain in legs with walking   [] Pain in legs with standing [] Pain in legs when laying flat   [] Claudication    [] Pain in feet when laying flat    [] History of DVT   [] Phlebitis   [x] Swelling in legs   [x] Varicose veins   [] Non-healing ulcers Pulmonary:   [] Uses home oxygen   [] Productive cough    [] Hemoptysis   [] Wheeze  [] COPD   [] Asthma Neurologic:  [] Dizziness   [] Seizures  [] Blackouts [] History of stroke   [] History of TIA  [] Aphasia   [] Temporary Blindness   [] Weakness or numbness in arm   [] Weakness or numbness in leg Musculoskeletal:   [] Joint swelling   [] Joint pain   [] Low back pain  []  History of Knee Replacement [x] Arthritis [] back Surgeries  []  Spinal Stenosis    Hematologic:  [] Easy bruising  [] Easy bleeding   [] Hypercoagulable state   [] Anemic Gastrointestinal:  [] Diarrhea   [] Vomiting  [] Gastroesophageal reflux/heartburn   [] Difficulty swallowing. [] Abdominal pain Genitourinary:  [] Chronic kidney disease   [] Difficult urination  [] Anuric   [] Blood in urine [] Frequent urination  [] Burning with urination   [] Hematuria Skin:  [] Rashes   [] Ulcers [] Wounds Psychological:  [] History of anxiety   []  History of major depression  []  Memory Difficulties      OBJECTIVE:   Physical Exam  BP 139/81 (BP Location: Left Arm, Patient Position: Sitting, Cuff Size: Normal)   Resp 12   Ht 5\' 4"  (1.626 m)   Wt 188 lb (85.3 kg)   BMI 32.27 kg/m   Gen: WD/WN, NAD Head: [place]/AT, No temporalis wasting.  Ear/Nose/Throat: Hearing grossly intact, nares w/o erythema or drainage Eyes: PER, EOMI, sclera nonicteric.  Neck: Supple, no masses.  No JVD.  Pulmonary:  Good air movement, no use of accessory muscles.  Cardiac: RRR Vascular: scattered varicosities present bilaterally.  Mild venous stasis changes to the legs bilaterally.  1+ soft pitting edema  Vessel Right Left  Radial Palpable Palpable  Dorsalis Pedis Palpable Palpable  Posterior Tibial Palpable Palpable   Gastrointestinal: soft, non-distended. No guarding/no peritoneal signs.  Musculoskeletal: M/S 5/5 throughout.  No deformity or atrophy.  Neurologic: Pain and light touch intact in extremities.  Symmetrical.  Speech is fluent. Motor exam as listed above. Psychiatric: Judgment intact, Mood & affect appropriate for pt's clinical  situation. Dermatologic: No Venous rashes. No Ulcers Noted.  No changes consistent with cellulitis. Lymph : No Cervical lymphadenopathy, no lichenification or skin changes of chronic lymphedema.       ASSESSMENT AND PLAN:  1. Varicose veins of leg with pain, bilateral At this time the patient would like to continue with conservative therapy prior to going to undergo sclerotherapy.  He would like to see if continued conservative therapy as well as continued healing will result in no more continued pain.  We discussed sclerotherapy in its uses.  The patient will follow-up in 3 months in order to determine if he would like  to proceed with sclerotherapy.  2. Hyperlipidemia, unspecified hyperlipidemia type Continue statin as ordered and reviewed, no changes at this time   3. Arthritis Continue NSAID medications as already ordered, these medications have been reviewed and there are no changes at this time.  Continued activity and therapy was stressed.    Current Outpatient Medications on File Prior to Visit  Medication Sig Dispense Refill  . atorvastatin (LIPITOR) 80 MG tablet TAKE 1 TABLET (80 MG TOTAL) BY MOUTH DAILY. 90 tablet 1  . DULoxetine (CYMBALTA) 30 MG capsule Take 1 capsule (30 mg total) by mouth daily. 30 capsule 0  . metFORMIN (GLUCOPHAGE) 500 MG tablet Take 1 tablet (500 mg total) by mouth 2 (two) times daily with a meal. 180 tablet 3   Current Facility-Administered Medications on File Prior to Visit  Medication Dose Route Frequency Provider Last Rate Last Dose  . 0.9 %  sodium chloride infusion  500 mL Intravenous Continuous Milus Banister, MD        There are no Patient Instructions on file for this visit. No follow-ups on file.   Kris Hartmann, NP  This note was completed with Sales executive.  Any errors are purely unintentional.

## 2019-09-11 ENCOUNTER — Ambulatory Visit: Payer: Managed Care, Other (non HMO) | Admitting: Rheumatology

## 2019-09-15 ENCOUNTER — Telehealth: Payer: Self-pay | Admitting: Internal Medicine

## 2019-09-15 NOTE — Telephone Encounter (Signed)
Lm to relay date/time for covid test.  09/16/2019 prior to 11:[facility]

## 2019-09-16 ENCOUNTER — Other Ambulatory Visit: Admission: RE | Admit: 2019-09-16 | Payer: Managed Care, Other (non HMO) | Source: Ambulatory Visit

## 2019-09-16 NOTE — Telephone Encounter (Signed)
Spoke to pt's daughter, Troy Ellis ([place]). Troy Ellis stated that pt is out of town working and is unable to answer his phone.  Pt is unable to make it to covid test today, as he is working two hours away. Thursday works best in the am work best for [employer].  Suanne Marker can you contact pt's daughter, Troy Ellis at [phone] to reschedule.

## 2019-09-16 NOTE — Telephone Encounter (Signed)
Called and spoke with patient's daughter, Troy Ellis [facility]) and advised that PFT has been R/S to Thurs 12/24/2019 to arrive at 8:00. COVID test to be arrange by nurse usually the day before the PFT. PFT instructions and new date/arrival time mailed to patient. Daughter aware of appointment. [name] Nothing else needed at this time. [name]

## 2019-09-16 NOTE — Telephone Encounter (Signed)
LMOVM for Cardiopulmonary to R/S PFT for Thursday am appointment. [name]

## 2019-09-17 ENCOUNTER — Ambulatory Visit: Payer: Managed Care, Other (non HMO)

## 2019-09-18 NOTE — Progress Notes (Signed)
Office Visit Note  Patient: Troy Ellis             Date of Birth: February 20, 1961           MRN: [id number]             PCP: Burnard Hawthorne, FNP Referring: Milus Banister, MD Visit Date: 10/02/2019 Occupation: Architect  Subjective:  Pain in multiple joints.   History of Present Illness: Troy Ellis is a 58 y.o. male accompanied by his daughter.  He has been seen in consultation per request of Dr. [name].  According to patient he has had joint pain for over 10 years.  The pain is started in his hands which is gradually getting worse over the last few years.  He notices some swelling in his left wrist.  He also complains of pain in his bilateral wrist joints, bilateral elbow joints, bilateral hips, bilateral knees and his feet.  He has had discomfort in his neck and lower back.  Patient states he does get shortness of breath on exertion.  He was seen by pulmonologist recently who felt that he may have COPD from secondary smoking as his wife used to smoke.  The PFTs are pending at this time.  His chest x-ray was unremarkable.  There is no family history of autoimmune disease.  Activities of Daily Living:  Patient reports morning stiffness for 30 minutes.   Patient Reports nocturnal pain.  Difficulty dressing/grooming: Denies Difficulty climbing stairs: Reports Difficulty getting out of chair: Reports Difficulty using hands for taps, buttons, cutlery, and/or writing: Denies  Review of Systems  Constitutional: Positive for fatigue. Negative for night sweats.  HENT: Positive for mouth dryness and nose dryness. Negative for mouth sores.   Eyes: Positive for dryness. Negative for redness and itching.  Respiratory: Positive for shortness of breath. Negative for wheezing and difficulty breathing.        Diagnosed with COPD  Cardiovascular: Negative for chest pain, palpitations, hypertension, irregular heartbeat and swelling in legs/feet.  Gastrointestinal: Negative for blood in  stool, constipation and diarrhea.  Endocrine: Negative for increased urination.  Genitourinary: Negative for difficulty urinating and painful urination.  Musculoskeletal: Positive for arthralgias, joint pain, joint swelling, muscle weakness, morning stiffness and muscle tenderness. Negative for myalgias and myalgias.  Skin: Negative for color change, rash, hair loss, nodules/bumps, skin tightness, ulcers and sensitivity to sunlight.  Allergic/Immunologic: Negative for susceptible to infections.  Neurological: Positive for numbness. Negative for dizziness, fainting, light-headedness, headaches, memory loss, night sweats and weakness.  Hematological: Positive for bruising/bleeding tendency. Negative for swollen glands.  Psychiatric/Behavioral: Positive for sleep disturbance. Negative for depressed mood and confusion. The patient is not nervous/anxious.     PMFS History:  Patient Active Problem List   Diagnosis Date Noted   Hyperlipidemia 04/24/2019   Varicose veins of leg with pain, bilateral 12/12/2018   Pain in both lower extremities 05/09/2018   Hair loss 05/09/2018   Other fatigue 05/09/2018   Acute midline low back pain without sciatica 06/28/2017   Diabetes ([place]) 10/06/2016   Thrombocytopenia ([place]) 10/06/2016   Arthritis 10/14/2015   Fatty liver 07/12/2015   Routine physical examination 02/06/2015    Past Medical History:  Diagnosis Date   Arthritis    both hands   Cataract    x2 surgeries   Depression    Diabetes mellitus without complication ([place])    controlled with diet and excercise   GERD (gastroesophageal reflux disease)    Hyperlipidemia  Family History  Problem Relation Age of Onset   Diabetes Brother    Diabetes Brother    Diabetes Daughter    Healthy Daughter    Colon cancer Neg Hx    Prostate cancer Neg Hx    Past Surgical History:  Procedure Laterality Date   EYE SURGERY     Lasix   laser vein surgery Bilateral 2020    legs   Social History   Social History Narrative   Works as Nature conservation officer.       Married.       Grandfather .   Immunization History  Administered Date(s) Administered   Influenza Split 09/16/2014   Influenza,inj,Quad PF,6+ Mos 10/07/2015, 10/02/2016   Pneumococcal Polysaccharide-23 02/06/2019   Tdap 02/06/2019     Objective: Vital Signs: BP 134/75 (BP Location: Right Arm, Patient Position: Sitting, Cuff Size: Normal)    Pulse 62    Resp 15    Ht 5\' 5"  (1.651 m)    Wt 188 lb (85.3 kg)    BMI 31.28 kg/m    Physical Exam Vitals signs and nursing note reviewed.  Constitutional:      Appearance: He is well-developed.  HENT:     Head: Normocephalic and atraumatic.  Eyes:     Conjunctiva/sclera: Conjunctivae normal.     Pupils: Pupils are equal, round, and reactive to light.  Neck:     Musculoskeletal: Normal range of motion and neck supple.  Cardiovascular:     Rate and Rhythm: Normal rate and regular rhythm.     Heart sounds: Normal heart sounds.  Pulmonary:     Effort: Pulmonary effort is normal.     Breath sounds: Normal breath sounds.  Abdominal:     General: Bowel sounds are normal.     Palpations: Abdomen is soft.  Skin:    General: Skin is warm and dry.     Capillary Refill: Capillary refill takes less than 2 seconds.  Neurological:     Mental Status: He is alert and oriented to person, place, and time.  Psychiatric:        Behavior: Behavior normal.      Musculoskeletal Exam: C-spine and lumbar spine were in good range of motion.  He has some discomfort in the lower lumbar region.  No SI joint tenderness was noted.  He had discomfort range of motion of bilateral shoulders more so on the right side.  He had no warmth swelling or effusion over his elbow joints.  He has some tenderness over wrist joints PIPs and DIP joints.  Thickening of PIP and DIP joints was noted with no synovitis.  No nail pitting was noted.  Both hip joints are in good range of  motion.  Hips painful range of motion of bilateral knee joints without any warmth swelling or effusion.  He has thickening of bilateral first MTP joint which were tender but no synovitis was noted.  CDAI Exam: CDAI Score: -- Patient Global: --; Provider Global: -- Swollen: --; Tender: -- Joint Exam   No joint exam has been documented for this visit   There is currently no information documented on the homunculus. Go to the Rheumatology activity and complete the homunculus joint exam.  Investigation: Findings:  08/08/18: ANA 1:640 nucleolar, A1-antitrypsin 135, mitochondrial ab-, Actin Ab-, Ceruoplasmin 34, Hep A ab reactive, Hep B and C non-reactive   Component     Latest Ref Rng & Units 08/08/2018  Hepatitis C Ab     NON-REACTI NON-REACTIVE  SIGNAL TO CUT-OFF     <1.00 0.01  ANA Pattern 1      NUCLEOLAR (A)  ANA Titer 1     titer 1:640 (H)  Hepatitis A AB,Total     NON-REACTI REACTIVE (A)  Hepatitis B Surface Ag     NON-REACTI NON-REACTIVE  Hep B S Ab     NON-REACTI NON-REACTIVE  Ceruloplasmin     18 - 36 mg/dL 34  Actin (Smooth Muscle) Antibody (IGG)     <20 U <20  Anti Nuclear Antibody (ANA)     NEGATIVE POSITIVE (A)  A-1 Antitrypsin, Ser     83 - 199 mg/dL 135  Mitochondrial M2 Ab, IgG     U < OR = 20.0   Imaging: Xr Foot 2 Views Left  Result Date: 10/02/2019 First MTP narrowing and subluxation was noted.  Spurring was noted.  PIP and DIP narrowing was noted.  No intertarsal or tibiotalar joint space narrowing was noted. Impression: These findings are consistent with osteoarthritis of the foot.  Xr Foot 2 Views Right  Result Date: 10/02/2019 First MTP narrowing subluxation and spurring was noted.  PIP and DIP narrowing was noted.  There was no narrowing of the MTP, intertarsal or tibiotalar joints.  Posterior calcaneal spur was noted. Impression: These findings are consistent with osteoarthritis of the foot.  Xr Hand 2 View Left  Result Date: 10/02/2019 PIP  and DIP narrowing was noted.  No MCP, intercarpal or radiocarpal joint space narrowing was noted.  No erosive changes were noted. Impression: These findings are consistent with osteoarthritis of the hand.  Xr Hand 2 View Right  Result Date: 10/02/2019 PIP and DIP narrowing was noted.  No MCP, intercarpal or radiocarpal joint space narrowing was noted.  No erosive changes were noted. Impression: These findings are consistent with osteoarthritis of the hand.  Xr Knee 3 View Left  Result Date: 10/02/2019 Mild medial compartment narrowing was noted.  Severe patellofemoral narrowing was noted.  No chondrocalcinosis was noted. 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  Xr Knee 3 View Right  Result Date: 10/02/2019 Mild medial compartment narrowing was noted.  Severe patellofemoral narrowing was noted.  Some calcification in the joint space was noted. 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   Recent Labs: Lab Results  Component Value Date   WBC 3.8 02/06/2019   HGB 15.3 02/06/2019   PLT 165 02/06/2019   NA 140 02/06/2019   K 4.3 02/06/2019   CL 102 02/06/2019   CO2 28 02/06/2019   GLUCOSE 212 (H) 02/06/2019   BUN 14 02/06/2019   CREATININE 0.79 02/06/2019   BILITOT 0.5 02/06/2019   ALKPHOS 127 (H) 11/07/2018   AST 25 02/06/2019   ALT 40 02/06/2019   PROT 7.3 02/06/2019   ALBUMIN 4.4 11/07/2018   CALCIUM 9.9 02/06/2019  February 06, 2019 TSH was normal,  Speciality Comments: No specialty comments available.  Procedures:  No procedures performed Allergies: Patient has no known allergies.   Assessment / Plan:     Visit Diagnoses: Positive ANA (antinuclear antibody) - 08/08/18: ANA 1:640 nucleolar, A1-antitrypsin 135, mitochondrial ab-, Actin Ab-, Ceruoplasmin 34, Hep A ab reactive, Hep B and C non-reactive  -patient is significantly positive ANA titer but no  clinical features of autoimmune disease.  I will obtain additional antibodies today.  He complains of intermittent joint swelling.  No synovitis was noted on examination today.  Plan: CBC with  Differential/Platelet, COMPLETE METABOLIC PANEL WITH GFR, Sedimentation rate, Anti-scleroderma antibody, RNP Antibody, Anti-Smith antibody, Sjogrens syndrome-A extractable nuclear antibody, Sjogrens syndrome-B extractable nuclear antibody, Anti-DNA antibody, double-stranded, C3 and C4  Polyarthralgia-he complains of pain in almost all of his joints.  Pain in both hands -he gives history of increasing stiffness in his hands and intermittent swelling in his left wrist.  No synovitis was noted.  He had tenderness across PIP joints.  Clinical and radiographic findings were consistent with osteoarthritis of bilateral hands.  Plan: XR Hand 2 View Right, XR Hand 2 View Left, Rheumatoid factor, Cyclic citrul peptide antibody, IgG, 14-3-3 eta Protein, Uric acid  Chronic pain of both knees -he complains of bilateral knee joint pain.  No warmth swelling effusion was noted.  X-ray findings were consistent with bilateral mild osteoarthritis and severe chondromalacia patella.  Plan: XR KNEE 3 VIEW RIGHT, XR KNEE 3 VIEW LEFT knee handout on knee exercises was given.  Pain in both feet -he complains of bilateral feet discomfort.  No synovitis was noted.  Clinical findings and radiographic findings are consistent with osteoarthritis in his feet.  Plan: XR Foot 2 Views Right, XR Foot 2 Views Left  Hair loss-he gives history of intermittent patchy loss of hair.  Which is not typical for autoimmune disease.  Other fatigue -complains of chronic fatigue.  Plan: Glucose 6 phosphate dehydrogenase  Shortness of breath-patient complains of shortness of breath on exertion.  He had pulmonary evaluation.  Presumptive diagnosis of COPD was made.  He had secondary smoking from his wife.  PFTs are pending.  Vitamin D deficiency-he has been on  vitamin D supplement.  Thrombocytopenia (H[facility])-recent labs did not show thrombocytopenia.  Chronic midline low back pain without sciatica-I reviewed x-rays of his lumbar spine from 2018 which showed mild degenerative changes.  A handout on back exercises was given.  Weight loss was also discussed.  Abnormal liver enzymes-his last LFTs were normal.  Fatty liver  History of hyperlipidemia  Varicose veins of leg with pain, bilateral  Orders: Orders Placed This Encounter  Procedures   XR Hand 2 View Right   XR Hand 2 View Left   XR KNEE 3 VIEW RIGHT   XR KNEE 3 VIEW LEFT   XR Foot 2 Views Right   XR Foot 2 Views Left   CBC with Differential/Platelet   COMPLETE METABOLIC PANEL WITH GFR   Sedimentation rate   Rheumatoid factor   Cyclic citrul peptide antibody, IgG   14-3-3 eta Protein   Uric acid   Anti-scleroderma antibody   RNP Antibody   Anti-Smith antibody   Sjogrens syndrome-A extractable nuclear antibody   Sjogrens syndrome-B extractable nuclear antibody   Anti-DNA antibody, double-stranded   C3 and C4   Glucose 6 phosphate dehydrogenase   No orders of the defined types were placed in this encounter.   Face-to-face time spent with patient was 50 minutes. Greater than 50% of time was spent in counseling and coordination of care.  Follow-Up Instructions: Return for Joint pain, osteoarthritis, positive ANA.   Bo Merino, MD  Note - This record has been created using Editor, commissioning.  Chart creation errors have been sought, but may not always  have been located. Such creation errors do not reflect on  the standard of medical care.

## 2019-09-25 ENCOUNTER — Ambulatory Visit: Payer: Managed Care, Other (non HMO) | Admitting: Rheumatology

## 2019-09-28 NOTE — Progress Notes (Deleted)
Office Visit Note  Patient: Troy Ellis             Date of Birth: [birth year]           MRN: [id number]             PCP: Burnard Hawthorne, FNP Referring: Burnard Hawthorne, FNP Visit Date: 10/09/2019 Occupation: @GUAROCC @  Subjective:  No chief complaint on file.   History of Present Illness: Troy Ellis is a 58 y.o. male ***   Activities of Daily Living:  Patient reports morning stiffness for *** {minute/hour:19697}.   Patient {ACTIONS;DENIES/REPORTS:21021675::"Denies"} nocturnal pain.  Difficulty dressing/grooming: {ACTIONS;DENIES/REPORTS:21021675::"Denies"} Difficulty climbing stairs: {ACTIONS;DENIES/REPORTS:21021675::"Denies"} Difficulty getting out of chair: {ACTIONS;DENIES/REPORTS:21021675::"Denies"} Difficulty using hands for taps, buttons, cutlery, and/or writing: {ACTIONS;DENIES/REPORTS:21021675::"Denies"}  No Rheumatology ROS completed.   PMFS History:  Patient Active Problem List   Diagnosis Date Noted  . Hyperlipidemia 04/24/2019  . 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 Pain in both lower extremities 05/09/2018  . Hair loss 05/09/2018  . Other fatigue 05/09/2018  . Acute midline low back pain without sciatica 06/28/2017  . Diabetes ([place]) 10/06/2016  . Thrombocytopenia ([place]) 10/06/2016  . Arthritis 10/14/2015  . Fatty liver 07/12/2015  . Routine physical examination 02/06/2015    Past Medical History:  Diagnosis Date  . Arthritis    both hands  . Cataract    x2 surgeries  . Depression   . Diabetes mellitus without complication ([place])    controlled with diet and excercise  . GERD (gastroesophageal reflux disease)   . Hyperlipidemia     Family History  Problem Relation Age of Onset  . Diabetes Brother   . Diabetes Brother   . Colon cancer Neg Hx   . Prostate cancer Neg Hx    Past Surgical History:  Procedure Laterality Date  . EYE SURGERY     Lasic   Social History   Social History Narrative   Works as  Nature conservation officer.       Married.       Grandfather .   Immunization History  Administered Date(s) Administered  . Influenza Split 09/16/2014  . Influenza,inj,Quad PF,6+ Mos 10/07/2015, 10/02/2016  . Pneumococcal Polysaccharide-23 02/06/2019  . Tdap 02/06/2019     Objective: Vital Signs: There were no vitals taken for this visit.   Physical Exam   Musculoskeletal Exam: ***  CDAI Exam: CDAI Score: - Patient Global: -; Provider Global: - Swollen: -; Tender: - Joint Exam   No joint exam has been documented for this visit   There is currently no information documented on the homunculus. Go to the Rheumatology activity and complete the homunculus joint exam.  Investigation: No additional findings.  Imaging: No results found.  Recent Labs: Lab Results  Component Value Date   WBC 3.8 02/06/2019   HGB 15.3 02/06/2019   PLT 165 02/06/2019   NA 140 02/06/2019   K 4.3 02/06/2019   CL 102 02/06/2019   CO2 28 02/06/2019   GLUCOSE 212 (H) 02/06/2019   BUN 14 02/06/2019   CREATININE 0.79 02/06/2019   BILITOT 0.5 02/06/2019   ALKPHOS 127 (H) 11/07/2018   AST 25 02/06/2019   ALT 40 02/06/2019   PROT 7.3 02/06/2019   ALBUMIN 4.4 11/07/2018   CALCIUM 9.9 02/06/2019    Speciality Comments: No specialty comments available.  Procedures:  No procedures performed Allergies: Patient has no known allergies.   Assessment / Plan:     Visit Diagnoses: No diagnosis  found.  Orders: No orders of the defined types were placed in this encounter.  No orders of the defined types were placed in this encounter.   Face-to-face time spent with patient was *** minutes. Greater than 50% of time was spent in counseling and coordination of care.  Follow-Up Instructions: No follow-ups on file.   Bo Merino, MD  Note - This record has been created using Editor, commissioning.  Chart creation errors have been sought, but may not always  have been located. Such creation errors do  not reflect on  the standard of medical care.

## 2019-10-02 ENCOUNTER — Ambulatory Visit: Payer: Managed Care, Other (non HMO) | Admitting: Rheumatology

## 2019-10-02 ENCOUNTER — Ambulatory Visit: Payer: Self-pay

## 2019-10-02 ENCOUNTER — Ambulatory Visit (INDEPENDENT_AMBULATORY_CARE_PROVIDER_SITE_OTHER): Payer: Managed Care, Other (non HMO)

## 2019-10-02 ENCOUNTER — Encounter: Payer: Self-pay | Admitting: Rheumatology

## 2019-10-02 ENCOUNTER — Telehealth (INDEPENDENT_AMBULATORY_CARE_PROVIDER_SITE_OTHER): Payer: Self-pay | Admitting: Vascular Surgery

## 2019-10-02 ENCOUNTER — Other Ambulatory Visit: Payer: Self-pay

## 2019-10-02 VITALS — BP 134/75 | HR 62 | Resp 15 | Ht 65.0 in | Wt 188.0 lb

## 2019-10-02 DIAGNOSIS — M79671 Pain in right foot: Secondary | ICD-10-CM

## 2019-10-02 DIAGNOSIS — M25561 Pain in right knee: Secondary | ICD-10-CM | POA: Diagnosis not present

## 2019-10-02 DIAGNOSIS — I83813 Varicose veins of bilateral lower extremities with pain: Secondary | ICD-10-CM

## 2019-10-02 DIAGNOSIS — M79641 Pain in right hand: Secondary | ICD-10-CM | POA: Diagnosis not present

## 2019-10-02 DIAGNOSIS — R5383 Other fatigue: Secondary | ICD-10-CM

## 2019-10-02 DIAGNOSIS — Z8639 Personal history of other endocrine, nutritional and metabolic disease: Secondary | ICD-10-CM

## 2019-10-02 DIAGNOSIS — M25562 Pain in left knee: Secondary | ICD-10-CM

## 2019-10-02 DIAGNOSIS — M545 Low back pain: Secondary | ICD-10-CM

## 2019-10-02 DIAGNOSIS — M79642 Pain in left hand: Secondary | ICD-10-CM | POA: Diagnosis not present

## 2019-10-02 DIAGNOSIS — R748 Abnormal levels of other serum enzymes: Secondary | ICD-10-CM

## 2019-10-02 DIAGNOSIS — M2550 Pain in unspecified joint: Secondary | ICD-10-CM | POA: Diagnosis not present

## 2019-10-02 DIAGNOSIS — L659 Nonscarring hair loss, unspecified: Secondary | ICD-10-CM

## 2019-10-02 DIAGNOSIS — R0602 Shortness of breath: Secondary | ICD-10-CM

## 2019-10-02 DIAGNOSIS — E559 Vitamin D deficiency, unspecified: Secondary | ICD-10-CM

## 2019-10-02 DIAGNOSIS — D696 Thrombocytopenia, unspecified: Secondary | ICD-10-CM

## 2019-10-02 DIAGNOSIS — M79672 Pain in left foot: Secondary | ICD-10-CM

## 2019-10-02 DIAGNOSIS — K760 Fatty (change of) liver, not elsewhere classified: Secondary | ICD-10-CM

## 2019-10-02 DIAGNOSIS — R768 Other specified abnormal immunological findings in serum: Secondary | ICD-10-CM

## 2019-10-02 DIAGNOSIS — G8929 Other chronic pain: Secondary | ICD-10-CM

## 2019-10-02 NOTE — Telephone Encounter (Signed)
Spoke with Dr. Lucky Cowboy and he advised to schedule with [facility]. Please contact patient to schedule. AS< CMA

## 2019-10-02 NOTE — Patient Instructions (Signed)
Back Exercises The following exercises strengthen the muscles that help to support the trunk and back. They also help to keep the lower back flexible. Doing these exercises can help to prevent back pain or lessen existing pain.  If you have back pain or discomfort, try doing these exercises 2-3 times each day or as told by your health care provider.  As your pain improves, do them once each day, but increase the number of times that you repeat the steps for each exercise (do more repetitions).  To prevent the recurrence of back pain, continue to do these exercises once each day or as told by your health care provider. Do exercises exactly as told by your health care provider and adjust them as directed. It is normal to feel mild stretching, pulling, tightness, or discomfort as you do these exercises, but you should stop right away if you feel sudden pain or your pain gets worse. Exercises Single knee to chest Repeat these steps 3-5 times for each leg: 1. Lie on your back on a firm bed or the floor with your legs extended. 2. Bring one knee to your chest. Your other leg should stay extended and in contact with the floor. 3. Hold your knee in place by grabbing your knee or thigh with both hands and hold. 4. Pull on your knee until you feel a gentle stretch in your lower back or buttocks. 5. Hold the stretch for 10-30 seconds. 6. Slowly release and straighten your leg. Pelvic tilt Repeat these steps 5-10 times: 1. Lie on your back on a firm bed or the floor with your legs extended. 2. Bend your knees so they are pointing toward the ceiling and your feet are flat on the floor. 3. Tighten your lower abdominal muscles to press your lower back against the floor. This motion will tilt your pelvis so your tailbone points up toward the ceiling instead of pointing to your feet or the floor. 4. With gentle tension and even breathing, hold this position for 5-10 seconds. Cat-cow Repeat these steps until  your lower back becomes more flexible: 1. Get into a hands-and-knees position on a firm surface. Keep your hands under your shoulders, and keep your knees under your hips. You may place padding under your knees for comfort. 2. Let your head hang down toward your chest. Contract your abdominal muscles and point your tailbone toward the floor so your lower back becomes rounded like the back of a cat. 3. Hold this position for 5 seconds. 4. Slowly lift your head, let your abdominal muscles relax and point your tailbone up toward the ceiling so your back forms a sagging arch like the back of a cow. 5. Hold this position for 5 seconds.  Press-ups Repeat these steps 5-10 times: 1. Lie on your abdomen (face-down) on the floor. 2. Place your palms near your head, about shoulder-width apart. 3. Keeping your back as relaxed as possible and keeping your hips on the floor, slowly straighten your arms to raise the top half of your body and lift your shoulders. Do not use your back muscles to raise your upper torso. You may adjust the placement of your hands to make yourself more comfortable. 4. Hold this position for 5 seconds while you keep your back relaxed. 5. Slowly return to lying flat on the floor.  Bridges Repeat these steps 10 times: 1. Lie on your back on a firm surface. 2. Bend your knees so they are pointing toward the ceiling and   your feet are flat on the floor. Your arms should be flat at your sides, next to your body. 3. Tighten your buttocks muscles and lift your buttocks off the floor until your waist is at almost the same height as your knees. You should feel the muscles working in your buttocks and the back of your thighs. If you do not feel these muscles, slide your feet 1-2 inches farther away from your buttocks. 4. Hold this position for 3-5 seconds. 5. Slowly lower your hips to the starting position, and allow your buttocks muscles to relax completely. If this exercise is too easy, try  doing it with your arms crossed over your chest. Abdominal crunches Repeat these steps 5-10 times: 1. Lie on your back on a firm bed or the floor with your legs extended. 2. Bend your knees so they are pointing toward the ceiling and your feet are flat on the floor. 3. Cross your arms over your chest. 4. Tip your chin slightly toward your chest without bending your neck. 5. Tighten your abdominal muscles and slowly raise your trunk (torso) high enough to lift your shoulder blades a tiny bit off the floor. Avoid raising your torso higher than that because it can put too much stress on your low back and does not help to strengthen your abdominal muscles. 6. Slowly return to your starting position. Back lifts Repeat these steps 5-10 times: 1. Lie on your abdomen (face-down) with your arms at your sides, and rest your forehead on the floor. 2. Tighten the muscles in your legs and your buttocks. 3. Slowly lift your chest off the floor while you keep your hips pressed to the floor. Keep the back of your head in line with the curve in your back. Your eyes should be looking at the floor. 4. Hold this position for 3-5 seconds. 5. Slowly return to your starting position. Contact a health care provider if:  Your back pain or discomfort gets much worse when you do an exercise.  Your worsening back pain or discomfort does not lessen within 2 hours after you exercise. If you have any of these problems, stop doing these exercises right away. Do not do them again unless your health care provider says that you can. Get help right away if:  You develop sudden, severe back pain. If this happens, stop doing the exercises right away. Do not do them again unless your health care provider says that you can. This information is not intended to replace advice given to you by your health care provider. Make sure you discuss any questions you have with your health care provider. Document Released: 01/10/2005 Document  Revised: 04/09/2019 Document Reviewed: 09/04/2018 Elsevier Patient Education  2020 [employer]itioners, 15(4), [phone]. Retrieved September 22, 2018 from http://clinicalkey.com/nursing">  Knee Exercises Ask your health care provider which exercises are safe for you. Do exercises exactly as told by your health care provider and adjust them as directed. It is normal to feel mild stretching, pulling, tightness, or discomfort as you do these exercises. Stop right away if you feel sudden pain or your pain gets worse. Do not begin these exercises until told by your health care provider. Stretching and range-of-motion exercises These exercises warm up your muscles and joints and improve the movement and flexibility of your knee. These exercises also help to relieve pain and swelling. Knee extension, prone 1. Lie on your abdomen (prone position) on a bed. 2. Place your left / right knee  just beyond the edge of the surface so your knee is not on the bed. You can put a towel under your left / right thigh just above your kneecap for comfort. 3. Relax your leg muscles and allow gravity to straighten your knee (extension). You should feel a stretch behind your left / right knee. 4. Hold this position for __________ seconds. 5. Scoot up so your knee is supported between repetitions. Repeat __________ times. Complete this exercise __________ times a day. Knee flexion, active  1. Lie on your back with both legs straight. If this causes back discomfort, bend your left / right knee so your foot is flat on the floor. 2. Slowly slide your left / right heel back toward your buttocks. Stop when you feel a gentle stretch in the front of your knee or thigh (flexion). 3. Hold this position for __________ seconds. 4. Slowly slide your left / right heel back to the starting position. Repeat __________ times. Complete this exercise __________ times a day. Quadriceps stretch, prone  1. Lie on your  abdomen on a firm surface, such as a bed or padded floor. 2. Bend your left / right knee and hold your ankle. If you cannot reach your ankle or pant leg, loop a belt around your foot and grab the belt instead. 3. Gently pull your heel toward your buttocks. Your knee should not slide out to the side. You should feel a stretch in the front of your thigh and knee (quadriceps). 4. Hold this position for __________ seconds. Repeat __________ times. Complete this exercise __________ times a day. Hamstring, supine 1. Lie on your back (supine position). 2. Loop a belt or towel over the ball of your left / right foot. The ball of your foot is on the walking surface, right under your toes. 3. Straighten your left / right knee and slowly pull on the belt to raise your leg until you feel a gentle stretch behind your knee (hamstring). ? Do not let your knee bend while you do this. ? Keep your other leg flat on the floor. 4. Hold this position for __________ seconds. Repeat __________ times. Complete this exercise __________ times a day. Strengthening exercises These exercises build strength and endurance in your knee. Endurance is the ability to use your muscles for a long time, even after they get tired. Quadriceps, isometric This exercise stretches the muscles in front of your thigh (quadriceps) without moving your knee joint (isometric). 1. Lie on your back with your left / right leg extended and your other knee bent. Put a rolled towel or small pillow under your knee if told by your health care provider. 2. Slowly tense the muscles in the front of your left / right thigh. You should see your kneecap slide up toward your hip or see increased dimpling just above the knee. This motion will push the back of the knee toward the floor. 3. For __________ seconds, hold the muscle as tight as you can without increasing your pain. 4. Relax the muscles slowly and completely. Repeat __________ times. Complete this  exercise __________ times a day. Straight leg raises This exercise stretches the muscles in front of your thigh (quadriceps) and the muscles that move your hips (hip flexors). 1. Lie on your back with your left / right leg extended and your other knee bent. 2. Tense the muscles in the front of your left / right thigh. You should see your kneecap slide up or see increased dimpling just above  the knee. Your thigh may even shake a bit. 3. Keep these muscles tight as you raise your leg 4-6 inches (10-15 cm) off the floor. Do not let your knee bend. 4. Hold this position for __________ seconds. 5. Keep these muscles tense as you lower your leg. 6. Relax your muscles slowly and completely after each repetition. Repeat __________ times. Complete this exercise __________ times a day. Hamstring, isometric 1. Lie on your back on a firm surface. 2. Bend your left / right knee about __________ degrees. 3. Dig your left / right heel into the surface as if you are trying to pull it toward your buttocks. Tighten the muscles in the back of your thighs (hamstring) to "dig" as hard as you can without increasing any pain. 4. Hold this position for __________ seconds. 5. Release the tension gradually and allow your muscles to relax completely for __________ seconds after each repetition. Repeat __________ times. Complete this exercise __________ times a day. Hamstring curls If told by your health care provider, do this exercise while wearing ankle weights. Begin with __________ lb weights. Then increase the weight by 1 lb (0.5 kg) increments. Do not wear ankle weights that are more than __________ lb. 1. Lie on your abdomen with your legs straight. 2. Bend your left / right knee as far as you can without feeling pain. Keep your hips flat against the floor. 3. Hold this position for __________ seconds. 4. Slowly lower your leg to the starting position. Repeat __________ times. Complete this exercise __________  times a day. Squats This exercise strengthens the muscles in front of your thigh and knee (quadriceps). 1. Stand in front of a table, with your feet and knees pointing straight ahead. You may rest your hands on the table for balance but not for support. 2. Slowly bend your knees and lower your hips like you are going to sit in a chair. ? Keep your weight over your heels, not over your toes. ? Keep your lower legs upright so they are parallel with the table legs. ? Do not let your hips go lower than your knees. ? Do not bend lower than told by your health care provider. ? If your knee pain increases, do not bend as low. 3. Hold the squat position for __________ seconds. 4. Slowly push with your legs to return to standing. Do not use your hands to pull yourself to standing. Repeat __________ times. Complete this exercise __________ times a day. Wall slides This exercise strengthens the muscles in front of your thigh and knee (quadriceps). 1. Lean your back against a smooth wall or door, and walk your feet out 18-24 inches (46-61 cm) from it. 2. Place your feet hip-width apart. 3. Slowly slide down the wall or door until your knees bend __________ degrees. Keep your knees over your heels, not over your toes. Keep your knees in line with your hips. 4. Hold this position for __________ seconds. Repeat __________ times. Complete this exercise __________ times a day. Straight leg raises This exercise strengthens the muscles that rotate the leg at the hip and move it away from your body (hip abductors). 1. Lie on your side with your left / right leg in the top position. Lie so your head, shoulder, knee, and hip line up. You may bend your bottom knee to help you keep your balance. 2. Roll your hips slightly forward so your hips are stacked directly over each other and your left / right knee is facing  forward. 3. Leading with your heel, lift your top leg 4-6 inches (10-15 cm). You should feel the  muscles in your outer hip lifting. ? Do not let your foot drift forward. ? Do not let your knee roll toward the ceiling. 4. Hold this position for __________ seconds. 5. Slowly return your leg to the starting position. 6. Let your muscles relax completely after each repetition. Repeat __________ times. Complete this exercise __________ times a day. Straight leg raises This exercise stretches the muscles that move your hips away from the front of the pelvis (hip extensors). 1. Lie on your abdomen on a firm surface. You can put a pillow under your hips if that is more comfortable. 2. Tense the muscles in your buttocks and lift your left / right leg about 4-6 inches (10-15 cm). Keep your knee straight as you lift your leg. 3. Hold this position for __________ seconds. 4. Slowly lower your leg to the starting position. 5. Let your leg relax completely after each repetition. Repeat __________ times. Complete this exercise __________ times a day. This information is not intended to replace advice given to you by your health care provider. Make sure you discuss any questions you have with your health care provider. Document Released: 10/17/2005 Document Revised: 09/23/2018 Document Reviewed: 09/23/2018 Elsevier Patient Education  2020 [employer].

## 2019-10-05 NOTE — Telephone Encounter (Signed)
LEFT VM FOR PATIENT TO CALL us BACK TO SCHEDULE. THANKS

## 2019-10-07 LAB — COMPLETE METABOLIC PANEL WITH GFR
AG Ratio: 1.7 (calc) (ref 1.0–2.5)
ALT: 60 U/L — ABNORMAL HIGH (ref 9–46)
AST: 31 U/L (ref 10–35)
Albumin: 4.2 g/dL (ref 3.6–5.1)
Alkaline phosphatase (APISO): 138 U/L (ref 35–144)
BUN/Creatinine Ratio: 25 (calc) — ABNORMAL HIGH (ref 6–22)
BUN: 17 mg/dL (ref 7–25)
CO2: 28 mmol/L (ref 20–32)
Calcium: 9.4 mg/dL (ref 8.6–10.3)
Chloride: 100 mmol/L (ref 98–110)
Creat: 0.68 mg/dL — ABNORMAL LOW (ref 0.70–1.33)
GFR, Est African American: 123 mL/min/{1.73_m2} (ref >=60)
GFR, Est Non African American: 106 mL/min/{1.73_m2} (ref >=60)
Globulin: 2.5 g/dL (calc) (ref 1.9–3.7)
Glucose, Bld: 337 mg/dL — ABNORMAL HIGH (ref 65–99)
Potassium: 4.3 mmol/L (ref 3.5–5.3)
Sodium: 137 mmol/L (ref 135–146)
Total Bilirubin: 0.6 mg/dL (ref 0.2–1.2)
Total Protein: 6.7 g/dL (ref 6.1–8.1)

## 2019-10-07 LAB — ANTI-DNA ANTIBODY, DOUBLE-STRANDED: ds DNA Ab: 1 IU/mL

## 2019-10-07 LAB — CBC WITH DIFFERENTIAL/PLATELET
Absolute Monocytes: 222 cells/uL (ref 200–950)
Basophils Absolute: 21 cells/uL (ref 0–200)
Basophils Relative: 0.7 %
Eosinophils Absolute: 111 cells/uL (ref 15–500)
Eosinophils Relative: 3.7 %
HCT: 43.9 % (ref 38.5–50.0)
Hemoglobin: 14.8 g/dL (ref 13.2–17.1)
Lymphs Abs: 933 cells/uL (ref 850–3900)
MCH: 30.5 pg (ref 27.0–33.0)
MCHC: 33.7 g/dL (ref 32.0–36.0)
MCV: 90.3 fL (ref 80.0–100.0)
MPV: 12.8 fL — ABNORMAL HIGH (ref 7.5–12.5)
Monocytes Relative: 7.4 %
Neutro Abs: 1713 cells/uL (ref 1500–7800)
Neutrophils Relative %: 57.1 %
Platelets: 140 10*3/uL (ref 140–400)
RBC: 4.86 10*6/uL (ref 4.20–5.80)
RDW: 13 % (ref 11.0–15.0)
Total Lymphocyte: 31.1 %
WBC: 3 10*3/uL — ABNORMAL LOW (ref 3.8–10.8)

## 2019-10-07 LAB — SJOGRENS SYNDROME-A EXTRACTABLE NUCLEAR ANTIBODY: SSA (Ro) (ENA) Antibody, IgG: <1 AI

## 2019-10-07 LAB — GLUCOSE 6 PHOSPHATE DEHYDROGENASE: G-6PDH: 13.9 U/g Hgb (ref 7.0–20.5)

## 2019-10-07 LAB — C3 AND C4
C3 Complement: 133 mg/dL (ref 82–185)
C4 Complement: 25 mg/dL (ref 15–53)

## 2019-10-07 LAB — ANTI-SCLERODERMA ANTIBODY: Scleroderma (Scl-70) (ENA) Antibody, IgG: <1 AI

## 2019-10-07 LAB — 14-3-3 ETA PROTEIN: 14-3-3 eta Protein: <0.2 ng/mL (ref <0.2)

## 2019-10-07 LAB — CYCLIC CITRUL PEPTIDE ANTIBODY, IGG: Cyclic Citrullin Peptide Ab: <16 UNITS

## 2019-10-07 LAB — RHEUMATOID FACTOR: Rheumatoid fact SerPl-aCnc: <14 IU/mL (ref <14)

## 2019-10-07 LAB — SEDIMENTATION RATE: Sed Rate: 2 mm/h (ref 0–20)

## 2019-10-07 LAB — RNP ANTIBODY: Ribonucleic Protein(ENA) Antibody, IgG: <1 AI

## 2019-10-07 LAB — ANTI-SMITH ANTIBODY: ENA SM Ab Ser-aCnc: <1 AI

## 2019-10-07 LAB — URIC ACID: Uric Acid, Serum: 3.3 mg/dL — ABNORMAL LOW (ref 4.0–8.0)

## 2019-10-07 LAB — SJOGRENS SYNDROME-B EXTRACTABLE NUCLEAR ANTIBODY: SSB (La) (ENA) Antibody, IgG: <1 AI

## 2019-10-08 NOTE — Progress Notes (Signed)
I will discuss labs at the follow-up visit.

## 2019-10-09 ENCOUNTER — Ambulatory Visit: Payer: Managed Care, Other (non HMO) | Admitting: Rheumatology

## 2019-10-09 NOTE — Progress Notes (Deleted)
Office Visit Note  Patient: Troy Ellis             Date of Birth: 27-Jan-1961           MRN: [id number]             PCP: Burnard Hawthorne, FNP Referring: Burnard Hawthorne, FNP Visit Date: 10/23/2019 Occupation: _0 @  Subjective:  No chief complaint on file.   History of Present Illness: Troy Ellis is a 58 y.o. male ***   Activities of Daily Living:  Patient reports morning stiffness for *** {minute/hour:19697}.   Patient {ACTIONS;DENIES/REPORTS:21021675::"Denies"} nocturnal pain.  Difficulty dressing/grooming: {ACTIONS;DENIES/REPORTS:21021675::"Denies"} Difficulty climbing stairs: {ACTIONS;DENIES/REPORTS:21021675::"Denies"} Difficulty getting out of chair: {ACTIONS;DENIES/REPORTS:21021675::"Denies"} Difficulty using hands for taps, buttons, cutlery, and/or writing: {ACTIONS;DENIES/REPORTS:21021675::"Denies"}  No Rheumatology ROS completed.   PMFS History:  Patient Active Problem List   Diagnosis Date Noted  . Hyperlipidemia 04/24/2019  . 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 Pain in both lower extremities 05/09/2018  . Hair loss 05/09/2018  . Other fatigue 05/09/2018  . Acute midline low back pain without sciatica 06/28/2017  . Diabetes ([place]) 10/06/2016  . Thrombocytopenia ([place]) 10/06/2016  . Arthritis 10/14/2015  . Fatty liver 07/12/2015  . Routine physical examination 02/06/2015    Past Medical History:  Diagnosis Date  . Arthritis    both hands  . Cataract    x2 surgeries  . Depression   . Diabetes mellitus without complication ([place])    controlled with diet and excercise  . GERD (gastroesophageal reflux disease)   . Hyperlipidemia     Family History  Problem Relation Age of Onset  . Diabetes Brother   . Diabetes Brother   . Diabetes Daughter   . Healthy Daughter   . Colon cancer Neg Hx   . Prostate cancer Neg Hx    Past Surgical History:  Procedure Laterality Date  . EYE SURGERY     Lasix  . laser vein  surgery Bilateral 2020   legs   Social History   Social History Narrative   Works as Nature conservation officer.       Married.       Grandfather .   Immunization History  Administered Date(s) Administered  . Influenza Split 09/16/2014  . Influenza,inj,Quad PF,6+ Mos 10/07/2015, 10/02/2016  . Pneumococcal Polysaccharide-23 02/06/2019  . Tdap 02/06/2019     Objective: Vital Signs: There were no vitals taken for this visit.   Physical Exam   Musculoskeletal Exam: ***  CDAI Exam: CDAI Score: - Patient Global: -; Provider Global: - Swollen: -; Tender: - Joint Exam   No joint exam has been documented for this visit   There is currently no information documented on the homunculus. Go to the Rheumatology activity and complete the homunculus joint exam.  Investigation: No additional findings.  Imaging: Xr Foot 2 Views Left  Result Date: 10/02/2019 First MTP narrowing and subluxation was noted.  Spurring was noted.  PIP and DIP narrowing was noted.  No intertarsal or tibiotalar joint space narrowing was noted. Impression: These findings are consistent with osteoarthritis of the foot.  Xr Foot 2 Views Right  Result Date: 10/02/2019 First MTP narrowing subluxation and spurring was noted.  PIP and DIP narrowing was noted.  There was no narrowing of the MTP, intertarsal or tibiotalar joints.  Posterior calcaneal spur was noted. Impression: These findings are consistent with osteoarthritis of the foot.  Xr Hand 2 View Left  Result Date: 10/02/2019 PIP and DIP  narrowing was noted.  No MCP, intercarpal or radiocarpal joint space narrowing was noted.  No erosive changes were noted. Impression: These findings are consistent with osteoarthritis of the hand.  Xr Hand 2 View Right  Result Date: 10/02/2019 PIP and DIP narrowing was noted.  No MCP, intercarpal or radiocarpal joint space narrowing was noted.  No erosive changes were noted.  Subluxation of right first MCP joint was noted  related to prior injury. Impression: These findings are consistent with osteoarthritis of the hand.  Xr Knee 3 View Left  Result Date: 10/02/2019 Mild medial compartment narrowing was noted.  Severe patellofemoral narrowing was noted.  No chondrocalcinosis was noted. 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  Xr Knee 3 View Right  Result Date: 10/02/2019 Mild medial compartment narrowing was noted.  Severe patellofemoral narrowing was noted.  Some calcification in the joint space was noted. 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   Recent Labs: Lab Results  Component Value Date   WBC 3.0 (L) 10/02/2019   HGB 14.8 10/02/2019   PLT 140 10/02/2019   NA 137 10/02/2019   K 4.3 10/02/2019   CL 100 10/02/2019   CO2 28 10/02/2019   GLUCOSE 337 (H) 10/02/2019   BUN 17 10/02/2019   CREATININE 0.68 (L) 10/02/2019   BILITOT 0.6 10/02/2019   ALKPHOS 127 (H) 11/07/2018   AST 31 10/02/2019   ALT 60 (H) 10/02/2019   PROT 6.7 10/02/2019   ALBUMIN 4.4 11/07/2018   CALCIUM 9.4 10/02/2019   GFRAA 123 10/02/2019  10/02/19 ENA negative, C3-C4 normal, ESR 2, RF negative, anti-CCP negative, _0 eta negative, uric acid 3.3 G6PD normal  Speciality Comments: No specialty comments available.  Procedures:  No procedures performed Allergies: Patient has no known allergies.   Assessment / Plan:     Visit Diagnoses: No diagnosis found.  Orders: No orders of the defined types were placed in this encounter.  No orders of the defined types were placed in this encounter.   Face-to-face time spent with patient was *** minutes. Greater than 50% of time was spent in counseling and coordination of care.  Follow-Up Instructions: No follow-ups on file.   Bo Merino, MD  Note - This record has been created using Editor, commissioning.  Chart creation errors have been sought,  but may not always  have been located. Such creation errors do not reflect on  the standard of medical care.

## 2019-10-14 ENCOUNTER — Other Ambulatory Visit: Payer: Self-pay | Admitting: Family

## 2019-10-14 DIAGNOSIS — M542 Cervicalgia: Secondary | ICD-10-CM

## 2019-10-23 ENCOUNTER — Ambulatory Visit: Payer: Managed Care, Other (non HMO) | Admitting: Rheumatology

## 2019-10-29 ENCOUNTER — Ambulatory Visit: Payer: Managed Care, Other (non HMO)

## 2019-12-04 ENCOUNTER — Encounter (INDEPENDENT_AMBULATORY_CARE_PROVIDER_SITE_OTHER): Payer: Self-pay | Admitting: Vascular Surgery

## 2019-12-04 ENCOUNTER — Ambulatory Visit (INDEPENDENT_AMBULATORY_CARE_PROVIDER_SITE_OTHER): Payer: Managed Care, Other (non HMO) | Admitting: Vascular Surgery

## 2019-12-04 ENCOUNTER — Other Ambulatory Visit: Payer: Self-pay

## 2019-12-04 VITALS — BP 172/89 | HR 63 | Resp 16 | Wt 187.0 lb

## 2019-12-04 DIAGNOSIS — M79604 Pain in right leg: Secondary | ICD-10-CM | POA: Diagnosis not present

## 2019-12-04 DIAGNOSIS — M1990 Unspecified osteoarthritis, unspecified site: Secondary | ICD-10-CM | POA: Diagnosis not present

## 2019-12-04 DIAGNOSIS — M79605 Pain in left leg: Secondary | ICD-10-CM

## 2019-12-04 DIAGNOSIS — E785 Hyperlipidemia, unspecified: Secondary | ICD-10-CM

## 2019-12-04 DIAGNOSIS — E119 Type 2 diabetes mellitus without complications: Secondary | ICD-10-CM

## 2019-12-04 DIAGNOSIS — I83813 Varicose veins of bilateral lower extremities with pain: Secondary | ICD-10-CM

## 2019-12-04 NOTE — Assessment & Plan Note (Signed)
The patient is still improved after laser ablation of both great saphenous veins, but he is noticing some increasing pain from residual superficial varicosities.  With this present, foam sclerotherapy would be an excellent option to treat the residual varicosities.  Risks and benefits were discussed with the patient and he is agreeable to proceed.

## 2019-12-04 NOTE — Patient Instructions (Signed)
Sclerotherapy  Sclerotherapy is a procedure that is done to improve the appearance of varicose veins and spider veins and to help relieve aching, swelling, cramping, and pain in the legs. Varicose veins are veins that have become enlarged, bulging, and twisted due to a damaged valve that causes blood to collect (pool) in the veins. Spider veins are small varicose veins. Sclerotherapy usually works best for smaller spider and varicose veins. This procedure involves injecting a chemical into the vein to close it off. You may need more than one treatment to close a vein all the way. Sclerotherapy is usually performed on the legs because that is where varicose and spider veins most often occur. Tell a health care provider about:  Any allergies you have.  All medicines you are taking, including vitamins, herbs, eye drops, creams, and over-the-counter medicines.  Any blood disorders you have.  Any surgeries you have had.  Any medical conditions you have.  Whether you are pregnant or may be pregnant. What are the risks? Generally, this is a safe procedure. However, problems may occur, including:  Infection.  Bleeding.  Allergic reactions to medicines or dyes.  Blood clots.  Nerve damage.  Bruising and scarring.  Darkened skin around the area. What happens before the procedure?  Do not use lotions or creams on your legs unless your health care provider approves.  Follow instructions from your health care provider about eating and drinking restrictions.  Do not use any products that contain nicotine or tobacco, such as cigarettes and e-cigarettes. If you need help quitting, ask your health care provider.  Ask your health care provider about: ? Changing or stopping your regular medicines. This is especially important if you are taking diabetes medicines or blood thinners. ? Taking medicines such as aspirin and ibuprofen. These medicines can thin your blood. Do not take these  medicines before your procedure if your health care provider instructs you not to.  You may have an ultrasound of the affected area to check for blood clots and to check blood flow.  In rare cases, you may have an X-ray procedure to check how blood flows through your veins (angiogram). For an angiogram, a dye is injected to outline your veins on X-rays. What happens during the procedure?  To lower your risk of infection: ? Your health care team will wash or sanitize their hands. ? Your skin will be washed with soap. ? Hair may be removed from the treatment area.  A small, thin needle will be used to inject a chemical (sclerosant) into your varicose vein. The sclerosant will irritate the lining of the vein and cause the vein to close below the injection site. You may feel some stinging, burning, or irritation.  The injection may be repeated for more than one varicose vein.  The injection area will be wrapped with elastic bandages. The procedure may vary among health care providers and hospitals. What happens after the procedure?  Your injection area will be wrapped with elastic bandages. If there is bleeding, the bandages may be changed.  Do not drive until your health care provider approves. You may need to wait 1-2 days before driving.  You will need to wear compression stockings for about a week, or as long as your health care provider recommends. Summary  Sclerotherapy is a procedure that is done to improve the appearance of varicose veins and spider veins and to help relieve aching, swelling, cramping, and pain in the legs.  A small, thin needle is   used to inject a chemical (sclerosant) into a spider vein or varicose vein to close it off.  Elastic bandages will be wrapped around the injection area after the procedure. This information is not intended to replace advice given to you by your health care provider. Make sure you discuss any questions you have with your health care  provider. Document Released: 01/22/2017 Document Revised: 03/27/2019 Document Reviewed: 01/22/2017 Elsevier Patient Education  [place].

## 2019-12-04 NOTE — Progress Notes (Signed)
MRN : [id number]  Troy Ellis is a 58 y.o. (1961/05/15) male who presents with chief complaint of  Chief Complaint  Patient presents with  . Follow-up    53month follow up  .  History of Present Illness: Patient returns today in follow up of his venous disease.  Since he had both laser ablations done about 4 months ago, in general his legs are feeling better and having less pain and swelling, but he is having more pain and some superficial varicosities on the medial calf and knee area.  This is present on both legs but is a little worse on the right.  No chest pain or shortness of breath.  Current Outpatient Medications  Medication Sig Dispense Refill  . atorvastatin (LIPITOR) 80 MG tablet TAKE 1 TABLET (80 MG TOTAL) BY MOUTH DAILY. 90 tablet 1  . DULoxetine (CYMBALTA) 30 MG capsule TAKE 1 CAPSULE BY MOUTH TWICE A DAY 180 capsule 1  . metFORMIN (GLUCOPHAGE) 500 MG tablet Take 1 tablet (500 mg total) by mouth 2 (two) times daily with a meal. 180 tablet 3  . metFORMIN (GLUCOPHAGE-XR) 500 MG 24 hr tablet TAKE 2 TABLETS BY MOUTH AT BEDTIME. 180 tablet 1   Current Facility-Administered Medications  Medication Dose Route Frequency Provider Last Rate Last Admin  . 0.9 %  sodium chloride infusion  500 mL Intravenous Continuous Milus Banister, MD        Past Medical History:  Diagnosis Date  . Arthritis    both hands  . Cataract    x2 surgeries  . Depression   . Diabetes mellitus without complication ([place])    controlled with diet and excercise  . GERD (gastroesophageal reflux disease)   . Hyperlipidemia     Past Surgical History:  Procedure Laterality Date  . EYE SURGERY     Lasix  . laser vein surgery Bilateral 2020   legs     Social History   Tobacco Use  . Smoking status: Never Smoker  . Smokeless tobacco: Never Used  Substance Use Topics  . Alcohol use: No    Alcohol/week: 0.0 standard drinks    Comment: occ  . Drug use: No    Family History  Problem  Relation Age of Onset  . Diabetes Brother   . Diabetes Brother   . Diabetes Daughter   . Healthy Daughter   . Colon cancer Neg Hx   . Prostate cancer Neg Hx     No Known Allergies   REVIEW OF SYSTEMS(Negative unless checked)  Constitutional: [] ??Weight loss[] ??Fever[] ??Chills Cardiac:[] ??Chest pain[] ??Chest pressure[] ??Palpitations [] ??Shortness of breath when laying flat [] ??Shortness of breath at rest [] ??Shortness of breath with exertion. Vascular: [x] ??Pain in legs with walking[x] ??Pain in legsat rest[] ??Pain in legs when laying flat [] ??Claudication [] ??Pain in feet when walking [] ??Pain in feet at rest [] ??Pain in feet when laying flat [] ??History of DVT [] ??Phlebitis [x] ??Swelling in legs [] ??Varicose veins [] ??Non-healing ulcers Pulmonary: [] ??Uses home oxygen [] ??Productive cough[] ??Hemoptysis [] ??Wheeze [] ??COPD [] ??Asthma Neurologic: [] ??Dizziness [] ??Blackouts [] ??Seizures [] ??History of stroke [] ??History of TIA[] ??Aphasia [] ??Temporary blindness[] ??Dysphagia [] ??Weaknessor numbness in arms [x] ??Weakness or numbnessin legs Musculoskeletal: [x] ??Arthritis [] ??Joint swelling [] ??Joint pain [] ??Low back pain Hematologic:[] ??Easy bruising[] ??Easy bleeding [] ??Hypercoagulable state [] ??Anemic [] ??Hepatitis Gastrointestinal:[] ??Blood in stool[] ??Vomiting blood[] ??Gastroesophageal reflux/heartburn[] ??Abdominal pain Genitourinary: [] ??Chronic kidney disease [] ??Difficulturination [] ??Frequenturination [] ??Burning with urination[] ??Hematuria Skin: [] ??Rashes [] ??Ulcers [] ??Wounds Psychological: [] ??History of anxiety[] ??History of major depression.   Physical Examination  BP (!) 172/89 (BP Location: Right Arm)   Pulse 63   Resp 16   Wt 187 lb (84.8 kg)  BMI 31.12 kg/m  Gen:  WD/WN, NAD Head: [place]/AT, No temporalis wasting. Ear/Nose/Throat: Hearing grossly  intact, nares w/o erythema or drainage Eyes: Conjunctiva clear. Sclera non-icteric Neck: Supple.  Trachea midline Pulmonary:  Good air movement, no use of accessory muscles.  Cardiac: RRR, no JVD Vascular: 2 to 2-1/2 mm varicosities on the medial right calf and lower thigh area.  Slightly smaller varicosities are present on the left leg. Vessel Right Left  Radial Palpable Palpable                          PT Palpable Palpable  DP Palpable Palpable   Gastrointestinal: soft, non-tender/non-distended. No guarding/reflex.  Musculoskeletal: M/S 5/5 throughout.  No deformity or atrophy.  No significant lower extremity edema. Neurologic: Sensation grossly intact in extremities.  Symmetrical.  Speech is fluent.  Psychiatric: Judgment intact, Mood & affect appropriate for pt's clinical situation. Dermatologic: No rashes or ulcers noted.  No cellulitis or open wounds.       Labs Recent Results (from the past 2160 hour(s))  CBC with Differential/Platelet     Status: Abnormal   Collection Time: 10/02/19  8:58 AM  Result Value Ref Range   WBC 3.0 (L) 3.8 - 10.8 Thousand/uL   RBC 4.86 4.20 - 5.80 Million/uL   Hemoglobin 14.8 13.2 - 17.1 g/dL   HCT 43.9 38.5 - 50.0 %   MCV 90.3 80.0 - 100.0 fL   MCH 30.5 27.0 - 33.0 pg   MCHC 33.7 32.0 - 36.0 g/dL   RDW 13.0 11.0 - 15.0 %   Platelets 140 140 - 400 Thousand/uL   MPV 12.8 (H) 7.5 - 12.5 fL   Neutro Abs 1,713 1,500 - 7,800 cells/uL   Lymphs Abs 933 850 - 3,900 cells/uL   Absolute Monocytes 222 200 - 950 cells/uL   Eosinophils Absolute 111 15 - 500 cells/uL   Basophils Absolute 21 0 - 200 cells/uL   Neutrophils Relative % 57.1 %   Total Lymphocyte 31.1 %   Monocytes Relative 7.4 %   Eosinophils Relative 3.7 %   Basophils Relative 0.7 %  COMPLETE METABOLIC PANEL WITH GFR     Status: Abnormal   Collection Time: 10/02/19  8:58 AM  Result Value Ref Range   Glucose, Bld 337 (H) 65 - 99 mg/dL    Comment: .            Fasting  reference interval . For someone without known diabetes, a glucose value >125 mg/dL indicates that they may have diabetes and this should be confirmed with a follow-up test. .    BUN 17 7 - 25 mg/dL   Creat 0.68 (L) 0.70 - 1.33 mg/dL    Comment: For patients >16 years of age, the reference limit for Creatinine is approximately 13% higher for people identified as African-American. .    GFR, Est Non African American 106 > OR = 60 mL/min/1.13m2   GFR, Est African American 123 > OR = 60 mL/min/1.53m2   BUN/Creatinine Ratio 25 (H) 6 - 22 (calc)   Sodium 137 135 - 146 mmol/L   Potassium 4.3 3.5 - 5.3 mmol/L   Chloride 100 98 - 110 mmol/L   CO2 28 20 - 32 mmol/L   Calcium 9.4 8.6 - 10.3 mg/dL   Total Protein 6.7 6.1 - 8.1 g/dL   Albumin 4.2 3.6 - 5.1 g/dL   Globulin 2.5 1.9 - 3.7 g/dL (calc)   AG Ratio 1.7 1.0 -  2.5 (calc)   Total Bilirubin 0.6 0.2 - 1.2 mg/dL   Alkaline phosphatase (APISO) 138 35 - 144 U/L   AST 31 10 - 35 U/L   ALT 60 (H) 9 - 46 U/L  Sedimentation rate     Status: None   Collection Time: 10/02/19  8:58 AM  Result Value Ref Range   Sed Rate 2 0 - 20 mm/h  Rheumatoid factor     Status: None   Collection Time: 10/02/19  8:58 AM  Result Value Ref Range   Rhuematoid fact SerPl-aCnc 99991111 99991111 IU/mL  Cyclic citrul peptide antibody, IgG     Status: None   Collection Time: 10/02/19  8:58 AM  Result Value Ref Range   Cyclic Citrullin Peptide Ab <16 UNITS    Comment: Reference Range Negative:            <20 Weak Positive:       20-39 Moderate Positive:   40-59 Strong Positive:     >59 .   14-3-3 eta Protein     Status: None   Collection Time: 10/02/19  8:58 AM  Result Value Ref Range   14-3-3 eta Protein <0.2 <0.2 ng/mL    Comment: . The 14-3-3eta protein is a marker of synovial inflammation that is released into synovial fluid and peripheral blood in rheumatoid arthritis (RA) and erosive psoriatic arthritis. One in five RF and CCP seronegative early stage RA  patients is found to be positive for 14-3-3eta protein. Patients with active joint RA disease have higher values of 14-3-3eta protein than those with inactive RA or psoriasis without arthritis. 14-3-3eta protein has a 93% specificity in patients with RA. Values > or = 0.2 ng/mL are elevated and indicative of RA disease or erosive psoriatic arthritis. Values >0.50 ng/mL are associated with more aggressive RA disease and poorer outcomes. Unlike RF and CCP, 14-3-3eta protein is a therapeutically modifiable marker to monitor response to therapy. A decrease in 14-3-3eta protein in response to DMARDs (disease-modifying antirheumatic drugs) and anti-TNF (tumor necrosis factor) drugs indicates better clinical outcomes; an increase is associated with  worse outcomes despite apparent clinical remission. . For further information please visit: [email or web address].[email or web address] . This test was developed and its analytical performance characteristics have been determined by [facility]. It has not been cleared or approved by FDA. This assay has been validated pursuant to the CLIA regulations and is used for clinical purposes. .   Uric acid     Status: Abnormal   Collection Time: 10/02/19  8:58 AM  Result Value Ref Range   Uric Acid, Serum 3.3 (L) 4.0 - 8.0 mg/dL    Comment: Therapeutic target for gout patients: <6.0 mg/dL .   Anti-scleroderma antibody     Status: None   Collection Time: 10/02/19  8:58 AM  Result Value Ref Range   Scleroderma (Scl-70) (ENA) Antibody, IgG <1.0 NEG <1.0 NEG AI  RNP Antibody     Status: None   Collection Time: 10/02/19  8:58 AM  Result Value Ref Range   Ribonucleic Protein(ENA) Antibody, IgG <1.0 NEG <1.0 NEG AI  Anti-Smith antibody     Status: None   Collection Time: 10/02/19  8:58 AM  Result Value Ref Range   ENA SM Ab Ser-aCnc <1.0 NEG <1.0 NEG AI  Sjogrens  syndrome-A extractable nuclear antibody     Status: None   Collection Time: 10/02/19  8:58 AM  Result Value Ref Range   SSA (Ro) (ENA)  Antibody, IgG <1.0 NEG <1.0 NEG AI  Sjogrens syndrome-B extractable nuclear antibody     Status: None   Collection Time: 10/02/19  8:58 AM  Result Value Ref Range   SSB ([place]) (ENA) Antibody, IgG <1.0 NEG <1.0 NEG AI  Anti-DNA antibody, double-stranded     Status: None   Collection Time: 10/02/19  8:58 AM  Result Value Ref Range   ds DNA Ab 1 IU/mL    Comment:                            IU/mL       Interpretation                            < or = 4    Negative                            5-9         Indeterminate                            > or = 10   Positive .   C3 and C4     Status: None   Collection Time: 10/02/19  8:58 AM  Result Value Ref Range   C3 Complement 133 82 - 185 mg/dL   C4 Complement 25 15 - 53 mg/dL  Glucose 6 phosphate dehydrogenase     Status: None   Collection Time: 10/02/19  8:58 AM  Result Value Ref Range   G-6PDH 13.9 7.0 - 20.5 U/g Hgb    Radiology No results found.  Assessment/Plan Arthritis Likely a contributing factor to his lower extremity symptoms. Does have a Baker's cyst on the right.  Diabetes ([place]) blood glucose control important in reducing the progression of atherosclerotic disease. Also, involved in wound healing. On appropriate medications.Could have a component of lower extremity neuropathy present as well.  Pain in both lower extremities Likely multifactorial though venous insufficiency is likely playing a major contributing role or a primary role.  Hyperlipidemia lipid control important in reducing the progression of atherosclerotic disease. Continue statin therapy  Varicose veins of leg with pain, bilateral The patient is still improved after laser ablation of both great saphenous veins, but he is noticing some increasing pain from residual superficial varicosities.  With this present, foam  sclerotherapy would be an excellent option to treat the residual varicosities.  Risks and benefits were discussed with the patient and he is agreeable to proceed.    Leotis Pain, MD  12/04/2019 9:43 AM    This note was created with Dragon medical transcription system.  Any errors from dictation are purely unintentional

## 2019-12-23 ENCOUNTER — Other Ambulatory Visit: Payer: Managed Care, Other (non HMO)

## 2019-12-24 ENCOUNTER — Ambulatory Visit: Payer: Managed Care, Other (non HMO)

## 2020-01-08 ENCOUNTER — Ambulatory Visit: Payer: Managed Care, Other (non HMO) | Admitting: Rheumatology

## 2020-01-15 ENCOUNTER — Encounter (INDEPENDENT_AMBULATORY_CARE_PROVIDER_SITE_OTHER): Payer: Self-pay | Admitting: Vascular Surgery

## 2020-01-15 ENCOUNTER — Ambulatory Visit (INDEPENDENT_AMBULATORY_CARE_PROVIDER_SITE_OTHER): Payer: Managed Care, Other (non HMO) | Admitting: Vascular Surgery

## 2020-01-15 ENCOUNTER — Other Ambulatory Visit: Payer: Self-pay

## 2020-01-15 VITALS — BP 128/72 | HR 65 | Resp 18 | Ht 64.0 in | Wt 191.0 lb

## 2020-01-15 DIAGNOSIS — I83813 Varicose veins of bilateral lower extremities with pain: Secondary | ICD-10-CM

## 2020-01-15 NOTE — Progress Notes (Signed)
Troy Ellis is a 59 y.o.male who presents with painful varicose veins of the right leg  Past Medical History:  Diagnosis Date  . Arthritis    both hands  . Cataract    x2 surgeries  . Depression   . Diabetes mellitus without complication ([place])    controlled with diet and excercise  . GERD (gastroesophageal reflux disease)   . Hyperlipidemia     Past Surgical History:  Procedure Laterality Date  . EYE SURGERY     Lasix  . laser vein surgery Bilateral 2020   legs    Current Outpatient Medications  Medication Sig Dispense Refill  . atorvastatin (LIPITOR) 80 MG tablet TAKE 1 TABLET (80 MG TOTAL) BY MOUTH DAILY. 90 tablet 1  . DULoxetine (CYMBALTA) 30 MG capsule TAKE 1 CAPSULE BY MOUTH TWICE A DAY 180 capsule 1  . metFORMIN (GLUCOPHAGE) 500 MG tablet Take 1 tablet (500 mg total) by mouth 2 (two) times daily with a meal. 180 tablet 3  . metFORMIN (GLUCOPHAGE-XR) 500 MG 24 hr tablet TAKE 2 TABLETS BY MOUTH AT BEDTIME. 180 tablet 1   Current Facility-Administered Medications  Medication Dose Route Frequency Provider Last Rate Last Admin  . 0.9 %  sodium chloride infusion  500 mL Intravenous Continuous Milus Banister, MD        No Known Allergies  Indication: Patient presents with symptomatic varicose veins of the right lower extremity.  Procedure: Foam sclerotherapy was performed on the right lower extremity. Using ultrasound guidance, 5 mL of foam Sotradecol was used to inject the varicosities of the right lower extremity. Compression wraps were placed. The patient tolerated the procedure well.

## 2020-02-05 ENCOUNTER — Encounter (INDEPENDENT_AMBULATORY_CARE_PROVIDER_SITE_OTHER): Payer: Self-pay | Admitting: Vascular Surgery

## 2020-02-05 ENCOUNTER — Other Ambulatory Visit: Payer: Self-pay

## 2020-02-05 ENCOUNTER — Ambulatory Visit (INDEPENDENT_AMBULATORY_CARE_PROVIDER_SITE_OTHER): Payer: Managed Care, Other (non HMO) | Admitting: Vascular Surgery

## 2020-02-05 VITALS — BP 134/85 | HR 63 | Resp 12 | Ht 66.0 in | Wt 195.0 lb

## 2020-02-05 DIAGNOSIS — I83813 Varicose veins of bilateral lower extremities with pain: Secondary | ICD-10-CM | POA: Diagnosis not present

## 2020-02-05 NOTE — Progress Notes (Signed)
  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 Arthritis    both hands  . Cataract    x2 surgeries  . Depression   . Diabetes mellitus without complication ([place])    controlled with diet and excercise  . GERD (gastroesophageal reflux disease)   . Hyperlipidemia     Past Surgical History:  Procedure Laterality Date  . EYE SURGERY     Lasix  . laser vein surgery Bilateral 2020   legs    Current Outpatient Medications  Medication Sig Dispense Refill  . atorvastatin (LIPITOR) 80 MG tablet TAKE 1 TABLET (80 MG TOTAL) BY MOUTH DAILY. 90 tablet 1  . DULoxetine (CYMBALTA) 30 MG capsule TAKE 1 CAPSULE BY MOUTH TWICE A DAY 180 capsule 1  . metFORMIN (GLUCOPHAGE) 500 MG tablet Take 1 tablet (500 mg total) by mouth 2 (two) times daily with a meal. 180 tablet 3  . metFORMIN (GLUCOPHAGE-XR) 500 MG 24 hr tablet TAKE 2 TABLETS BY MOUTH AT BEDTIME. 180 tablet 1   Current Facility-Administered Medications  Medication Dose Route Frequency Provider Last Rate Last Admin  . 0.9 %  sodium chloride infusion  500 mL Intravenous Continuous Milus Banister, MD        No Known Allergies  Indication: Patient presents with symptomatic varicose veins of the left lower extremity.  Procedure: Foam sclerotherapy was performed on the left lower extremity. Using ultrasound guidance, 5 mL of foam Sotradecol was used to inject the varicosities of the left lower extremity. Compression wraps were placed. The patient tolerated the procedure well.

## 2020-02-08 ENCOUNTER — Encounter: Payer: Managed Care, Other (non HMO) | Admitting: Family

## 2020-02-11 ENCOUNTER — Telehealth (INDEPENDENT_AMBULATORY_CARE_PROVIDER_SITE_OTHER): Payer: Self-pay

## 2020-02-11 NOTE — Telephone Encounter (Signed)
Typically sclerotherapy shouldn't cause cramping but some discomfort may be possible due to the inflammation in the vein.  Some swelling may also be present.  If she is concerned about a DVT, we can bring him in for a DVT study only.  If it is positive we can work him in.  If not, the reaction is likely due to some inflammation following the sclerotherapy and he can try Ibuprofen 800 mg every 6-8 hours and cool compresses.

## 2020-02-11 NOTE — Telephone Encounter (Signed)
Patient wife was made aware with medical advice and stated at this time she does not think he has a dvt but she will try the cool compresses and ibuprofen.

## 2020-03-04 ENCOUNTER — Ambulatory Visit (INDEPENDENT_AMBULATORY_CARE_PROVIDER_SITE_OTHER): Payer: Managed Care, Other (non HMO) | Admitting: Vascular Surgery

## 2020-07-19 ENCOUNTER — Telehealth: Payer: Self-pay

## 2020-07-19 IMAGING — US US ABDOMEN LIMITED
1 series · 14 of 25 positions shown · non-contrast
Comparison: None.

CLINICAL DATA: Elevated LFTs

EXAM:
ULTRASOUND ABDOMEN LIMITED RIGHT UPPER QUADRANT

[Series 1: us abdomen limited · 14 of 67 slices shown]
[im 1/67]
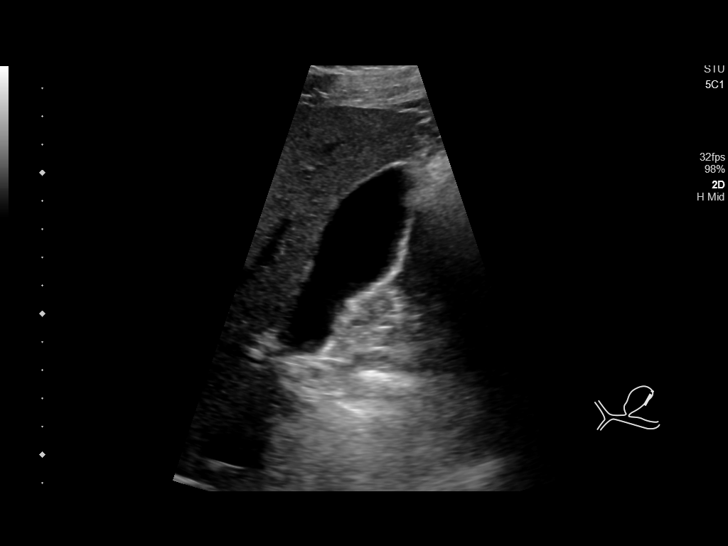
[im 6/67]
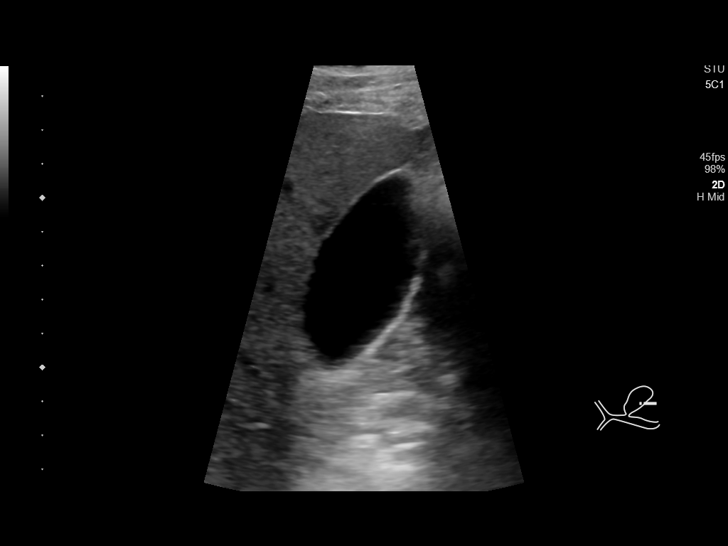
[im 12/67]
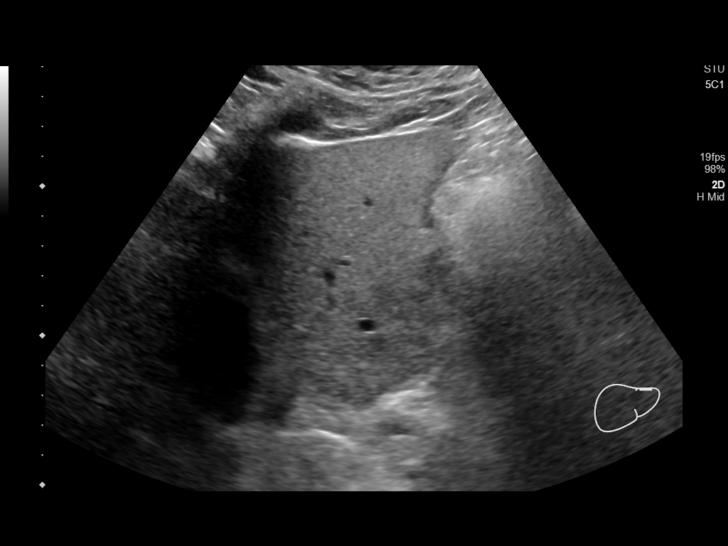
[im 17/67]
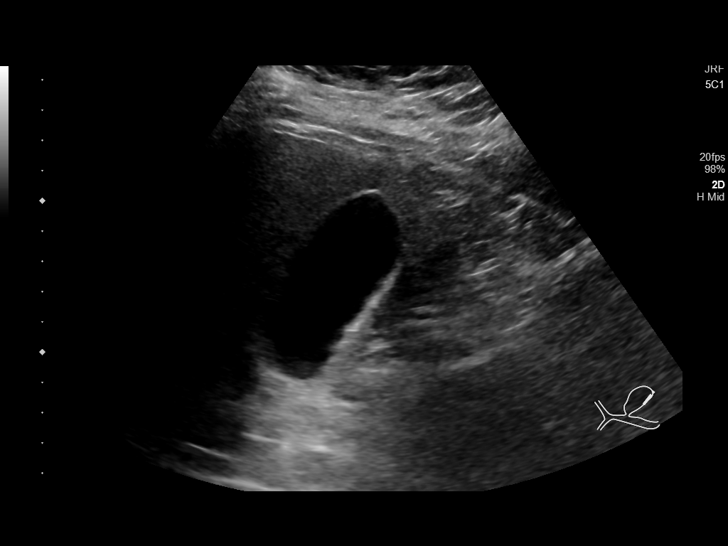
[im 23/67]
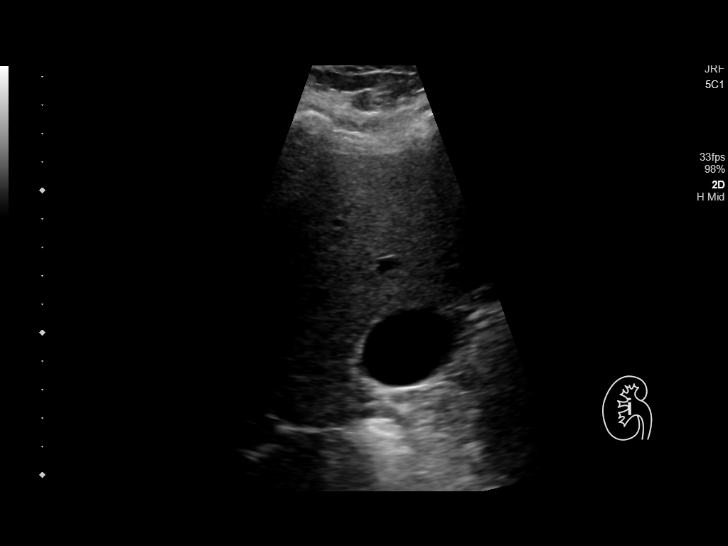
[im 25/67]
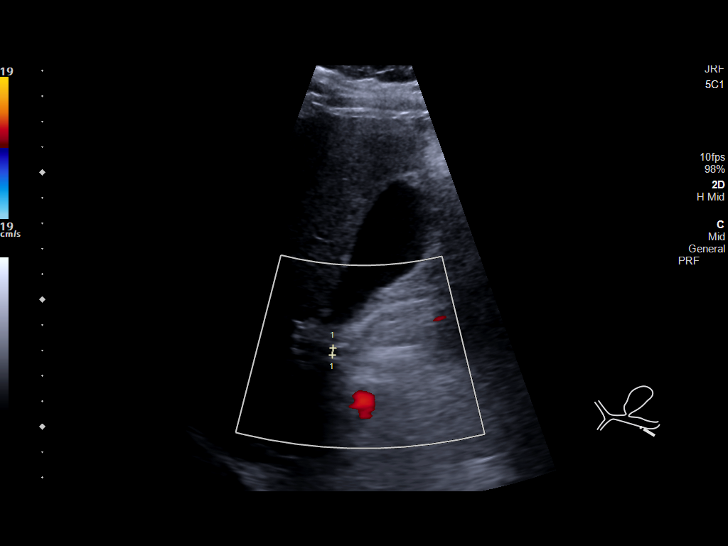
[im 31/67]
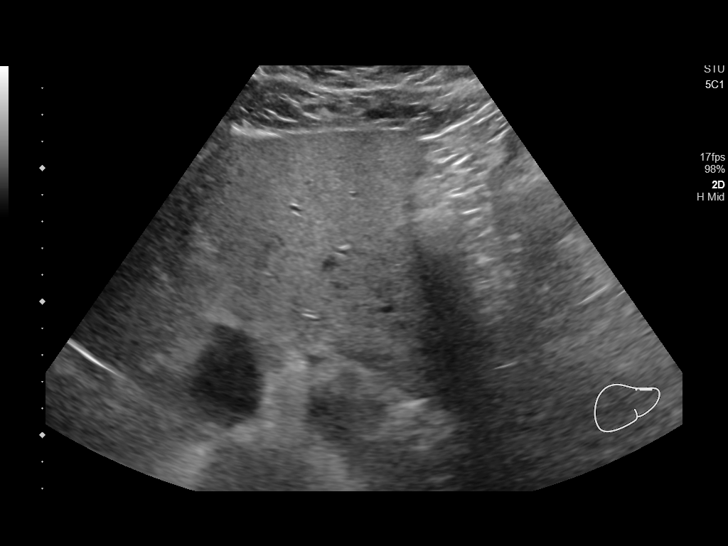
[im 36/67]
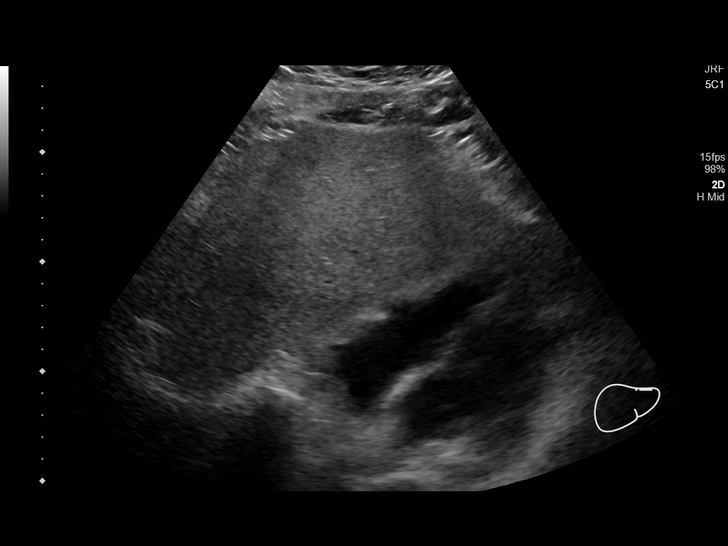
[im 42/67]
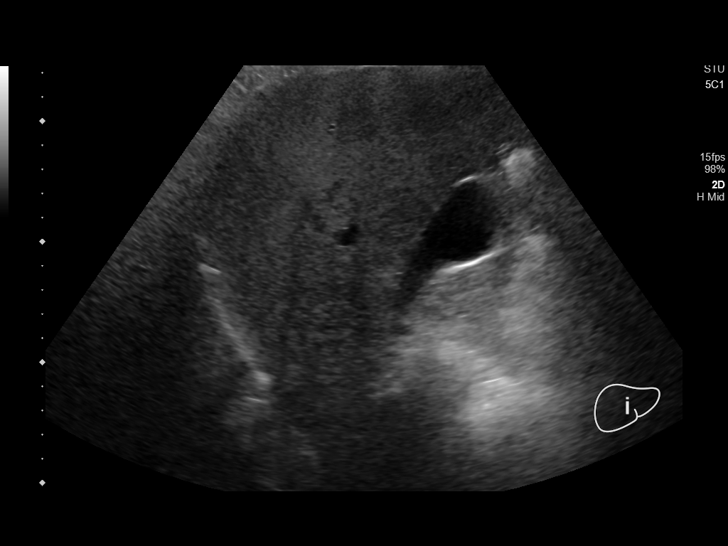
[im 45/67]
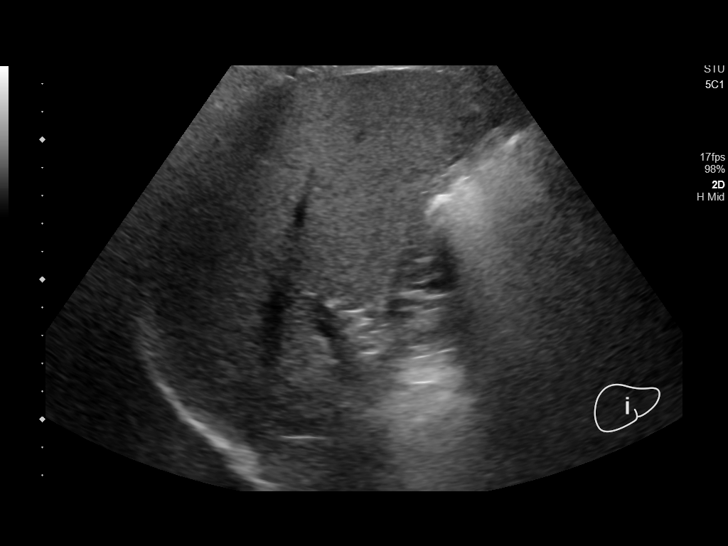
[im 50/67]
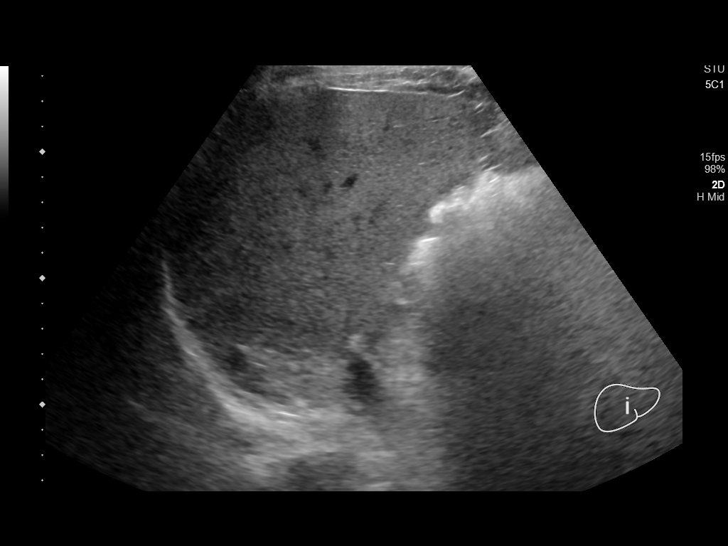
[im 56/67]
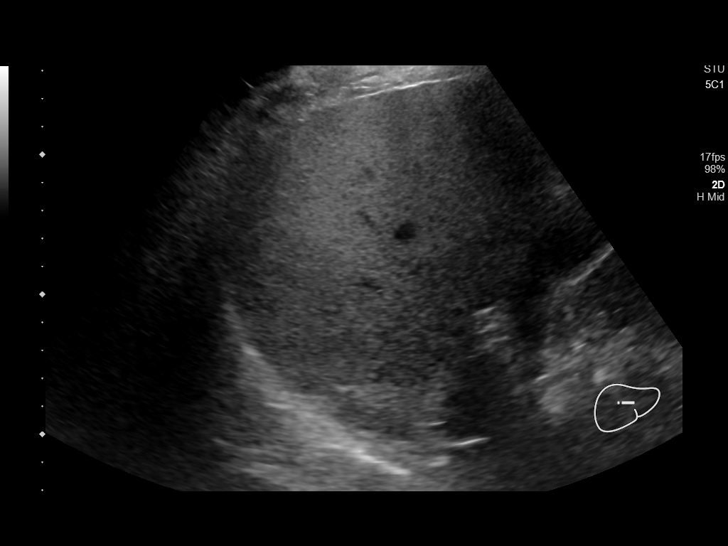
[im 61/67]
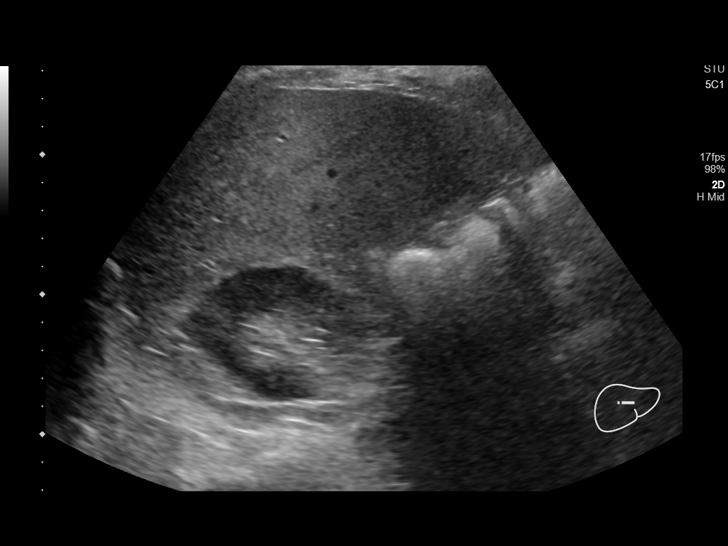
[im 67/67]
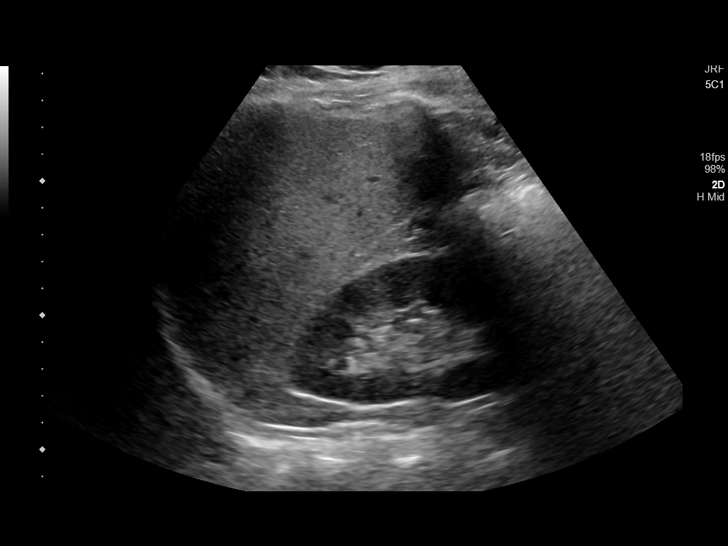

[14 of 25 positions shown; findings below may reference images not displayed]

FINDINGS: Gallbladder:

No gallstones or wall thickening visualized. No sonographic Murphy
sign noted by sonographer.

Common bile duct:

Diameter: 2.8 mm

Liver:

Mild increased hepatic echogenicity suggesting early fatty
infiltration. No focal hepatic abnormality or intrahepatic biliary
dilatation. No surrounding free fluid or ascites. Portal vein is
patent on color Doppler imaging with normal direction of blood flow
towards the liver.
IMPRESSION: Query mild hepatic steatosis. Otherwise no acute finding by right
upper quadrant ultrasound. Negative for gallstones.

## 2020-07-19 NOTE — Telephone Encounter (Signed)
Called patient to schedule an office visit and address care gaps. Patient works out of town and left a verbal message with his daughter to call back and schedule when available.

## 2020-09-09 ENCOUNTER — Other Ambulatory Visit: Payer: Self-pay | Admitting: Family

## 2020-09-09 DIAGNOSIS — E785 Hyperlipidemia, unspecified: Secondary | ICD-10-CM

## 2021-03-07 IMAGING — CR CHEST - 2 VIEW
2 series · 2 of 2 positions shown · non-contrast
Comparison: None.

CLINICAL DATA: Shortness of breath for 6 months.

EXAM:
CHEST - 2 VIEW

[chest pa]
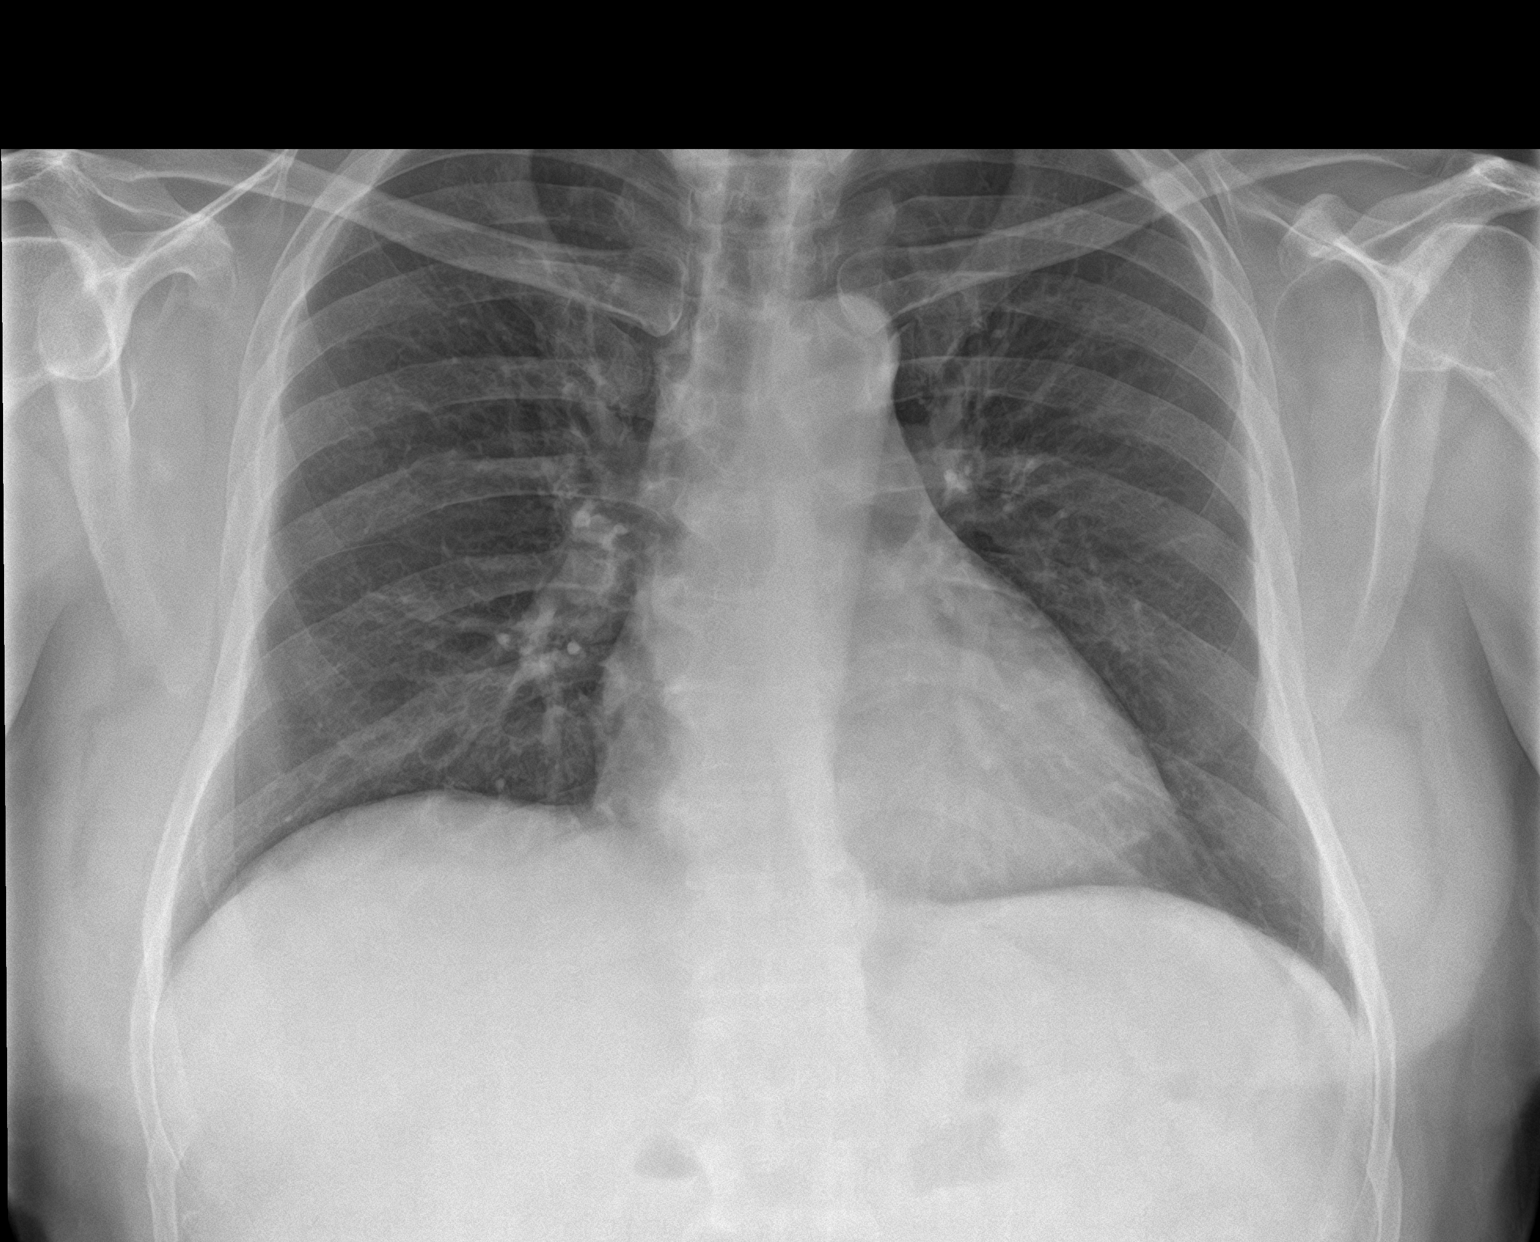

[chest lat]
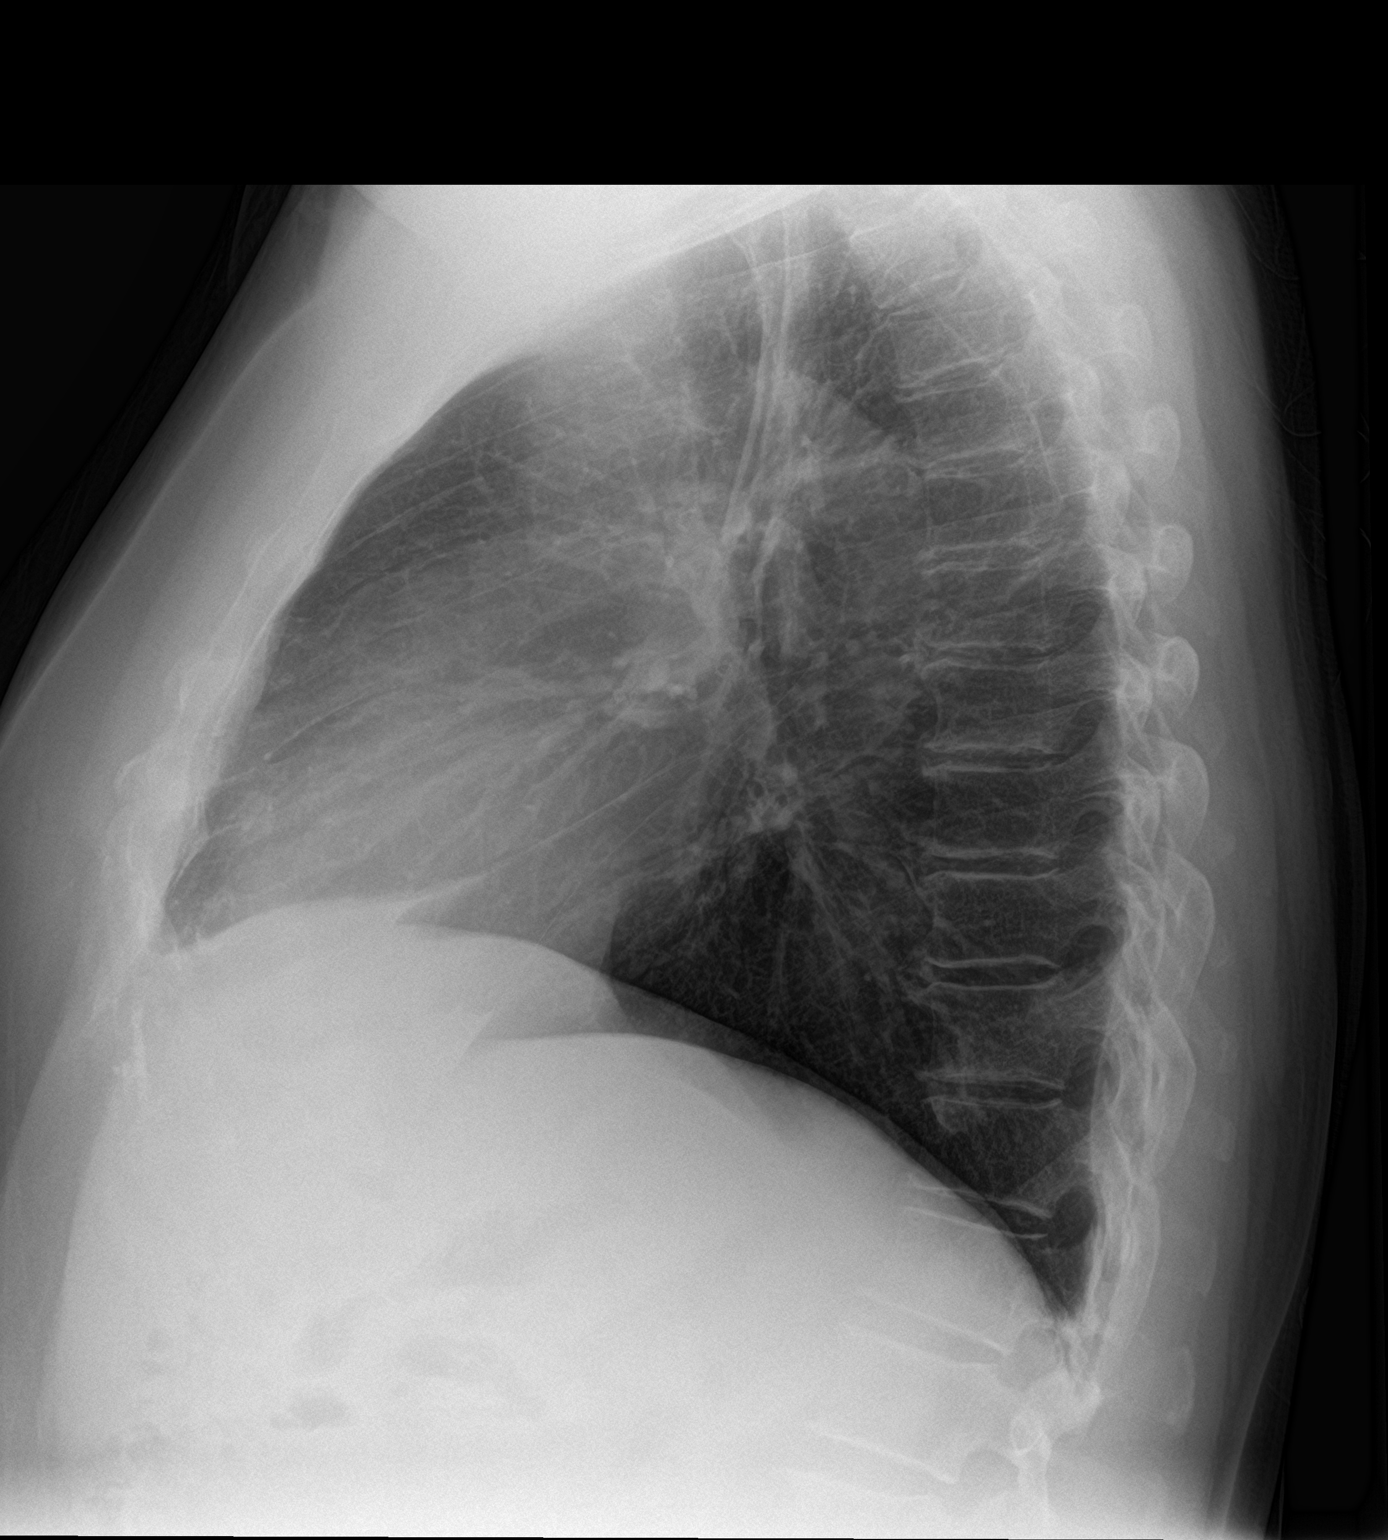

[2 of 2 positions shown; findings below may reference images not displayed]

FINDINGS: Cardiomediastinal silhouette is normal. Mediastinal contours appear
intact.

There is no evidence of focal airspace consolidation, pleural
effusion or pneumothorax.

Osseous structures are without acute abnormality. Soft tissues are
grossly normal.
IMPRESSION: No active cardiopulmonary disease.

## 2021-05-19 DIAGNOSIS — G4733 Obstructive sleep apnea (adult) (pediatric): Secondary | ICD-10-CM | POA: Diagnosis not present

## 2021-05-19 DIAGNOSIS — F32A Depression, unspecified: Secondary | ICD-10-CM | POA: Diagnosis not present

## 2021-05-19 DIAGNOSIS — E7800 Pure hypercholesterolemia, unspecified: Secondary | ICD-10-CM | POA: Diagnosis not present

## 2021-05-19 DIAGNOSIS — E119 Type 2 diabetes mellitus without complications: Secondary | ICD-10-CM | POA: Diagnosis not present

## 2021-05-19 DIAGNOSIS — E1165 Type 2 diabetes mellitus with hyperglycemia: Secondary | ICD-10-CM | POA: Diagnosis not present

## 2021-05-19 DIAGNOSIS — Z1331 Encounter for screening for depression: Secondary | ICD-10-CM | POA: Diagnosis not present

## 2021-08-25 DIAGNOSIS — F32A Depression, unspecified: Secondary | ICD-10-CM | POA: Diagnosis not present

## 2021-08-25 DIAGNOSIS — E119 Type 2 diabetes mellitus without complications: Secondary | ICD-10-CM | POA: Diagnosis not present

## 2021-08-25 DIAGNOSIS — E7800 Pure hypercholesterolemia, unspecified: Secondary | ICD-10-CM | POA: Diagnosis not present

## 2021-08-25 DIAGNOSIS — G4733 Obstructive sleep apnea (adult) (pediatric): Secondary | ICD-10-CM | POA: Diagnosis not present

## 2021-08-31 ENCOUNTER — Encounter: Payer: Self-pay | Admitting: Family

## 2021-09-22 DIAGNOSIS — M5412 Radiculopathy, cervical region: Secondary | ICD-10-CM | POA: Diagnosis not present

## 2021-09-22 DIAGNOSIS — S8392XA Sprain of unspecified site of left knee, initial encounter: Secondary | ICD-10-CM | POA: Diagnosis not present

## 2021-10-09 DIAGNOSIS — S86302A Unspecified injury of muscle(s) and tendon(s) of peroneal muscle group at lower leg level, left leg, initial encounter: Secondary | ICD-10-CM | POA: Diagnosis not present

## 2021-10-09 DIAGNOSIS — M791 Myalgia, unspecified site: Secondary | ICD-10-CM | POA: Diagnosis not present

## 2021-10-09 DIAGNOSIS — M79662 Pain in left lower leg: Secondary | ICD-10-CM | POA: Diagnosis not present

## 2021-10-09 DIAGNOSIS — Z6831 Body mass index (BMI) 31.0-31.9, adult: Secondary | ICD-10-CM | POA: Diagnosis not present

## 2021-10-09 DIAGNOSIS — M79605 Pain in left leg: Secondary | ICD-10-CM | POA: Diagnosis not present

## 2021-10-17 DIAGNOSIS — Z23 Encounter for immunization: Secondary | ICD-10-CM | POA: Diagnosis not present

## 2021-10-17 DIAGNOSIS — M79605 Pain in left leg: Secondary | ICD-10-CM | POA: Diagnosis not present

## 2021-11-03 DIAGNOSIS — M1712 Unilateral primary osteoarthritis, left knee: Secondary | ICD-10-CM | POA: Diagnosis not present

## 2022-01-05 DIAGNOSIS — G4733 Obstructive sleep apnea (adult) (pediatric): Secondary | ICD-10-CM | POA: Diagnosis not present

## 2022-01-05 DIAGNOSIS — F32A Depression, unspecified: Secondary | ICD-10-CM | POA: Diagnosis not present

## 2022-01-05 DIAGNOSIS — E119 Type 2 diabetes mellitus without complications: Secondary | ICD-10-CM | POA: Diagnosis not present

## 2022-01-05 DIAGNOSIS — Z125 Encounter for screening for malignant neoplasm of prostate: Secondary | ICD-10-CM | POA: Diagnosis not present

## 2022-01-05 DIAGNOSIS — E7800 Pure hypercholesterolemia, unspecified: Secondary | ICD-10-CM | POA: Diagnosis not present

## 2022-04-06 DIAGNOSIS — M25462 Effusion, left knee: Secondary | ICD-10-CM | POA: Diagnosis not present

## 2022-05-02 DIAGNOSIS — M25462 Effusion, left knee: Secondary | ICD-10-CM | POA: Diagnosis not present

## 2022-05-04 DIAGNOSIS — M1712 Unilateral primary osteoarthritis, left knee: Secondary | ICD-10-CM | POA: Diagnosis not present

## 2022-08-30 DIAGNOSIS — E7800 Pure hypercholesterolemia, unspecified: Secondary | ICD-10-CM | POA: Diagnosis not present

## 2022-08-30 DIAGNOSIS — E119 Type 2 diabetes mellitus without complications: Secondary | ICD-10-CM | POA: Diagnosis not present

## 2022-08-30 DIAGNOSIS — F32A Depression, unspecified: Secondary | ICD-10-CM | POA: Diagnosis not present

## 2022-08-30 DIAGNOSIS — G4733 Obstructive sleep apnea (adult) (pediatric): Secondary | ICD-10-CM | POA: Diagnosis not present

## 2022-10-15 ENCOUNTER — Encounter (INDEPENDENT_AMBULATORY_CARE_PROVIDER_SITE_OTHER): Payer: Self-pay

## 2023-04-12 DIAGNOSIS — Z125 Encounter for screening for malignant neoplasm of prostate: Secondary | ICD-10-CM | POA: Diagnosis not present

## 2023-04-12 DIAGNOSIS — E119 Type 2 diabetes mellitus without complications: Secondary | ICD-10-CM | POA: Diagnosis not present

## 2023-04-12 DIAGNOSIS — F32A Depression, unspecified: Secondary | ICD-10-CM | POA: Diagnosis not present

## 2023-04-12 DIAGNOSIS — Z79899 Other long term (current) drug therapy: Secondary | ICD-10-CM | POA: Diagnosis not present

## 2023-04-12 DIAGNOSIS — E7800 Pure hypercholesterolemia, unspecified: Secondary | ICD-10-CM | POA: Diagnosis not present

## 2023-04-12 DIAGNOSIS — G4733 Obstructive sleep apnea (adult) (pediatric): Secondary | ICD-10-CM | POA: Diagnosis not present

## 2023-04-30 DIAGNOSIS — G4733 Obstructive sleep apnea (adult) (pediatric): Secondary | ICD-10-CM | POA: Diagnosis not present

## 2023-05-31 DIAGNOSIS — G4733 Obstructive sleep apnea (adult) (pediatric): Secondary | ICD-10-CM | POA: Diagnosis not present

## 2023-06-30 DIAGNOSIS — G4733 Obstructive sleep apnea (adult) (pediatric): Secondary | ICD-10-CM | POA: Diagnosis not present

## 2023-07-31 DIAGNOSIS — G4733 Obstructive sleep apnea (adult) (pediatric): Secondary | ICD-10-CM | POA: Diagnosis not present

## 2023-08-31 DIAGNOSIS — G4733 Obstructive sleep apnea (adult) (pediatric): Secondary | ICD-10-CM | POA: Diagnosis not present

## 2023-09-30 DIAGNOSIS — G4733 Obstructive sleep apnea (adult) (pediatric): Secondary | ICD-10-CM | POA: Diagnosis not present

## 2023-10-18 DIAGNOSIS — E7800 Pure hypercholesterolemia, unspecified: Secondary | ICD-10-CM | POA: Diagnosis not present

## 2023-10-18 DIAGNOSIS — G4733 Obstructive sleep apnea (adult) (pediatric): Secondary | ICD-10-CM | POA: Diagnosis not present

## 2023-10-18 DIAGNOSIS — E119 Type 2 diabetes mellitus without complications: Secondary | ICD-10-CM | POA: Diagnosis not present

## 2023-10-18 DIAGNOSIS — K760 Fatty (change of) liver, not elsewhere classified: Secondary | ICD-10-CM | POA: Diagnosis not present

## 2023-10-31 DIAGNOSIS — G4733 Obstructive sleep apnea (adult) (pediatric): Secondary | ICD-10-CM | POA: Diagnosis not present

## 2024-02-28 DIAGNOSIS — J09X2 Influenza due to identified novel influenza A virus with other respiratory manifestations: Secondary | ICD-10-CM | POA: Diagnosis not present

## 2024-02-28 DIAGNOSIS — R059 Cough, unspecified: Secondary | ICD-10-CM | POA: Diagnosis not present

## 2024-03-05 DIAGNOSIS — J111 Influenza due to unidentified influenza virus with other respiratory manifestations: Secondary | ICD-10-CM | POA: Diagnosis not present

## 2024-05-15 DIAGNOSIS — K760 Fatty (change of) liver, not elsewhere classified: Secondary | ICD-10-CM | POA: Diagnosis not present

## 2024-05-15 DIAGNOSIS — E119 Type 2 diabetes mellitus without complications: Secondary | ICD-10-CM | POA: Diagnosis not present

## 2024-05-15 DIAGNOSIS — E7800 Pure hypercholesterolemia, unspecified: Secondary | ICD-10-CM | POA: Diagnosis not present

## 2024-05-15 DIAGNOSIS — G4733 Obstructive sleep apnea (adult) (pediatric): Secondary | ICD-10-CM | POA: Diagnosis not present

## 2024-10-05 DIAGNOSIS — E119 Type 2 diabetes mellitus without complications: Secondary | ICD-10-CM | POA: Diagnosis not present

## 2024-10-05 DIAGNOSIS — K760 Fatty (change of) liver, not elsewhere classified: Secondary | ICD-10-CM | POA: Diagnosis not present

## 2024-10-05 DIAGNOSIS — Z125 Encounter for screening for malignant neoplasm of prostate: Secondary | ICD-10-CM | POA: Diagnosis not present

## 2024-10-05 DIAGNOSIS — E7800 Pure hypercholesterolemia, unspecified: Secondary | ICD-10-CM | POA: Diagnosis not present

## 2024-10-05 DIAGNOSIS — G4733 Obstructive sleep apnea (adult) (pediatric): Secondary | ICD-10-CM | POA: Diagnosis not present

## 2024-10-05 DIAGNOSIS — Z23 Encounter for immunization: Secondary | ICD-10-CM | POA: Diagnosis not present
